# Patient Record
Sex: Female | Born: 1960 | Race: White | Hispanic: No | Smoking: Never smoker
Health system: Southern US, Community
[De-identification: ages and names within clinical notes are randomized; demographics above are authoritative.]

## PROBLEM LIST (undated history)

## (undated) DIAGNOSIS — N200 Calculus of kidney: Secondary | ICD-10-CM

## (undated) DIAGNOSIS — L900 Lichen sclerosus et atrophicus: Secondary | ICD-10-CM

## (undated) DIAGNOSIS — A6000 Herpesviral infection of urogenital system, unspecified: Secondary | ICD-10-CM

## (undated) DIAGNOSIS — F419 Anxiety disorder, unspecified: Secondary | ICD-10-CM

## (undated) DIAGNOSIS — F32A Depression, unspecified: Secondary | ICD-10-CM

## (undated) DIAGNOSIS — E785 Hyperlipidemia, unspecified: Secondary | ICD-10-CM

## (undated) DIAGNOSIS — F329 Major depressive disorder, single episode, unspecified: Secondary | ICD-10-CM

## (undated) HISTORY — DX: Depression, unspecified: F32.A

## (undated) HISTORY — PX: KNEE ARTHROSCOPY: SUR90

## (undated) HISTORY — DX: Herpesviral infection of urogenital system, unspecified: A60.00

## (undated) HISTORY — PX: TONSILLECTOMY: SUR1361

## (undated) HISTORY — DX: Major depressive disorder, single episode, unspecified: F32.9

## (undated) HISTORY — DX: Anxiety disorder, unspecified: F41.9

## (undated) HISTORY — DX: Calculus of kidney: N20.0

## (undated) HISTORY — DX: Hyperlipidemia, unspecified: E78.5

## (undated) HISTORY — PX: FOOT SURGERY: SHX648

## (undated) HISTORY — DX: Lichen sclerosus et atrophicus: L90.0

---

## 1995-07-23 HISTORY — PX: BREAST BIOPSY: SHX20

## 2007-04-11 ENCOUNTER — Ambulatory Visit: Payer: Self-pay | Admitting: Family Medicine

## 2009-01-26 ENCOUNTER — Ambulatory Visit: Payer: Self-pay | Admitting: Family Medicine

## 2009-04-17 ENCOUNTER — Ambulatory Visit: Payer: Self-pay | Admitting: Gastroenterology

## 2009-04-17 LAB — HM COLONOSCOPY

## 2010-08-31 ENCOUNTER — Ambulatory Visit: Payer: Self-pay | Admitting: Urology

## 2010-10-11 ENCOUNTER — Ambulatory Visit: Payer: Self-pay | Admitting: Urology

## 2010-10-14 ENCOUNTER — Ambulatory Visit: Payer: Self-pay | Admitting: Urology

## 2010-10-17 HISTORY — PX: LITHOTRIPSY: SUR834

## 2010-10-29 ENCOUNTER — Ambulatory Visit: Payer: Self-pay | Admitting: Urology

## 2011-01-26 ENCOUNTER — Ambulatory Visit: Payer: Self-pay | Admitting: Urology

## 2012-01-23 DIAGNOSIS — N3020 Other chronic cystitis without hematuria: Secondary | ICD-10-CM | POA: Insufficient documentation

## 2012-01-23 DIAGNOSIS — N3946 Mixed incontinence: Secondary | ICD-10-CM | POA: Insufficient documentation

## 2012-01-24 ENCOUNTER — Ambulatory Visit: Payer: Self-pay | Admitting: Urology

## 2013-01-23 ENCOUNTER — Ambulatory Visit: Payer: Self-pay | Admitting: Urology

## 2013-05-20 IMAGING — CR DG ABDOMEN 1V
1 series · 1 of 1 positions shown · non-contrast
Comparison: none

REASON FOR EXAM: nephrolithiasis pt need films
COMMENTS:

[view not recorded]
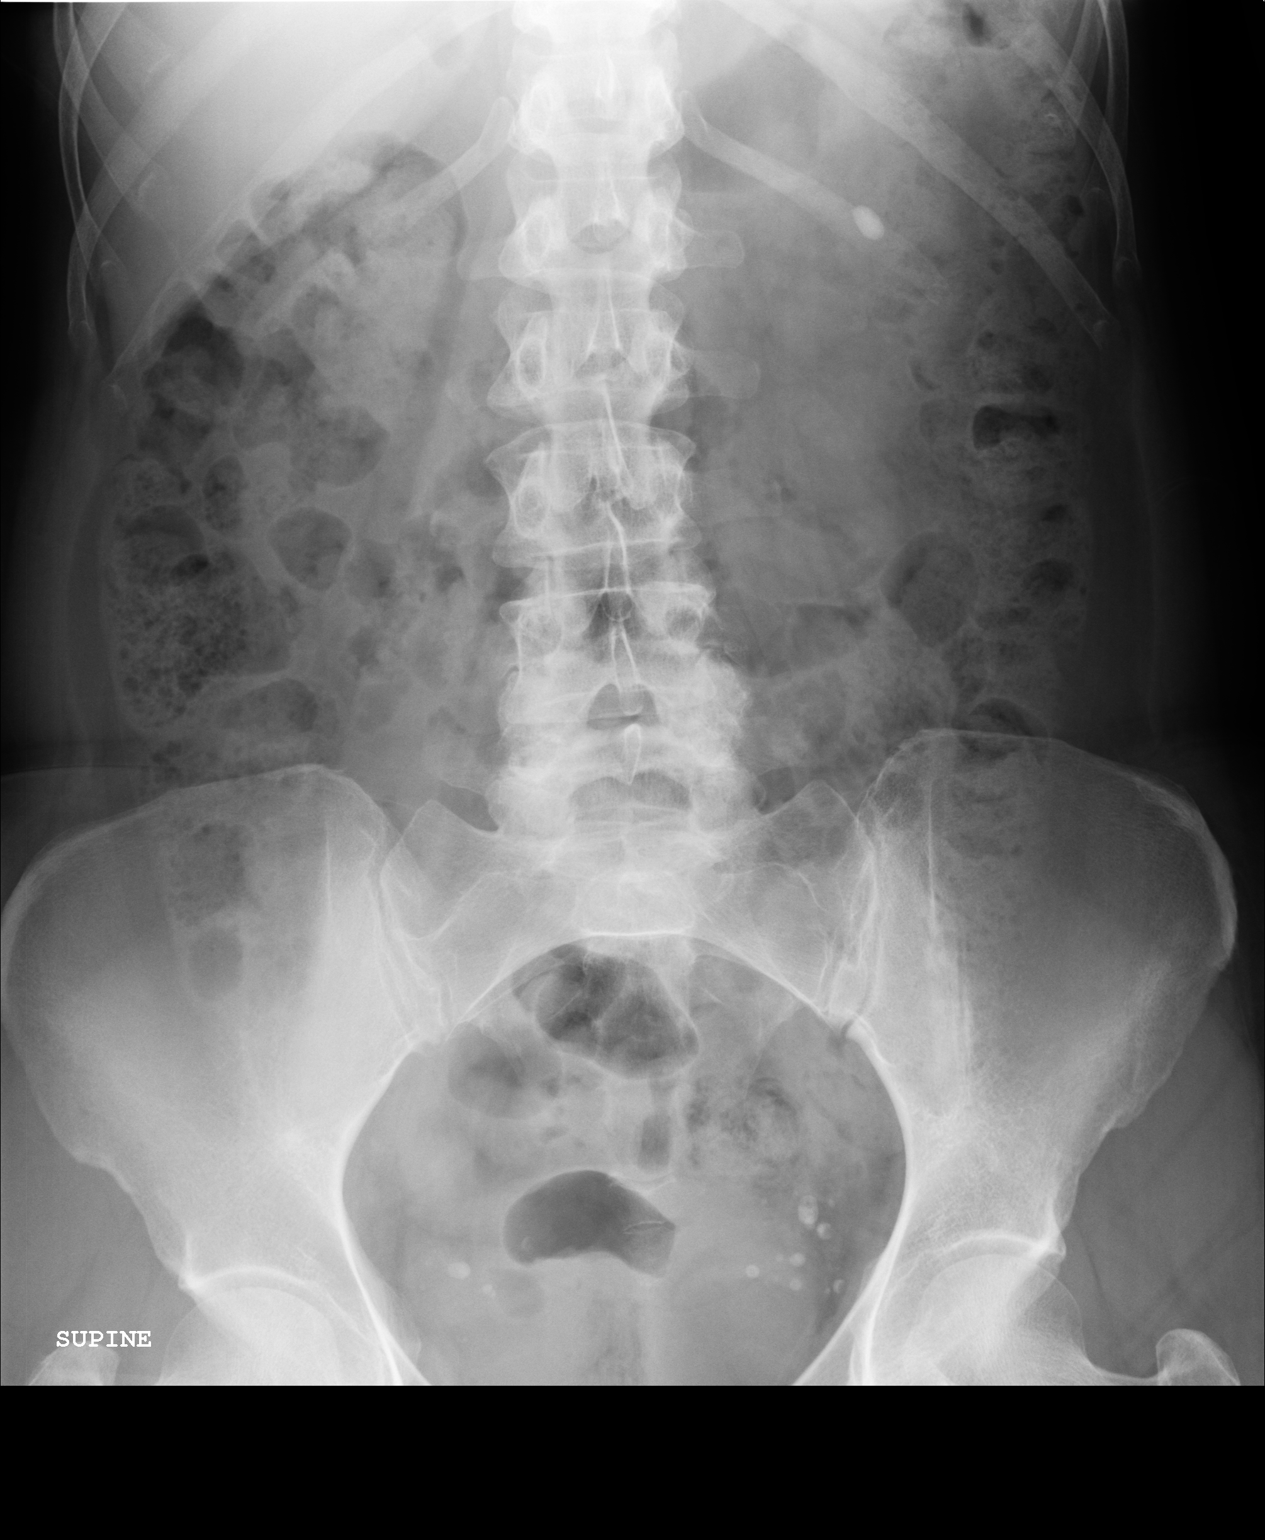

[1 of 1 positions shown; findings below may reference images not displayed]

PROCEDURE:     DXR - DXR KIDNEY URETER BLADDER  - August 31, 2010  [DATE]

RESULT:     There is a 1 cm calcification projected over the left upper
quadrant consistent with a left renal stone. No other renal or ureteral
stones are seen. There are multiple phleboliths noted in the pelvis
bilaterally. There is a large amount of fecal material in the colon. No
dilated bowel loops are seen.
IMPRESSION: Left nephrolithiasis.

## 2014-09-18 DIAGNOSIS — E559 Vitamin D deficiency, unspecified: Secondary | ICD-10-CM | POA: Insufficient documentation

## 2014-09-18 DIAGNOSIS — Z87442 Personal history of urinary calculi: Secondary | ICD-10-CM | POA: Insufficient documentation

## 2014-09-18 DIAGNOSIS — E7800 Pure hypercholesterolemia, unspecified: Secondary | ICD-10-CM | POA: Insufficient documentation

## 2014-09-18 DIAGNOSIS — N951 Menopausal and female climacteric states: Secondary | ICD-10-CM | POA: Insufficient documentation

## 2014-09-18 DIAGNOSIS — N3010 Interstitial cystitis (chronic) without hematuria: Secondary | ICD-10-CM | POA: Insufficient documentation

## 2014-09-18 DIAGNOSIS — F329 Major depressive disorder, single episode, unspecified: Secondary | ICD-10-CM | POA: Insufficient documentation

## 2014-09-18 DIAGNOSIS — F419 Anxiety disorder, unspecified: Secondary | ICD-10-CM | POA: Insufficient documentation

## 2014-09-18 DIAGNOSIS — G4700 Insomnia, unspecified: Secondary | ICD-10-CM | POA: Insufficient documentation

## 2014-09-18 DIAGNOSIS — L900 Lichen sclerosus et atrophicus: Secondary | ICD-10-CM | POA: Insufficient documentation

## 2014-09-18 DIAGNOSIS — F32A Depression, unspecified: Secondary | ICD-10-CM | POA: Insufficient documentation

## 2014-09-19 ENCOUNTER — Ambulatory Visit (INDEPENDENT_AMBULATORY_CARE_PROVIDER_SITE_OTHER): Payer: BLUE CROSS/BLUE SHIELD | Admitting: Family Medicine

## 2014-09-19 ENCOUNTER — Encounter: Payer: Self-pay | Admitting: Family Medicine

## 2014-09-19 VITALS — BP 104/62 | HR 72 | Temp 97.9°F | Resp 14 | Ht 65.0 in | Wt 139.0 lb

## 2014-09-19 DIAGNOSIS — N3091 Cystitis, unspecified with hematuria: Secondary | ICD-10-CM

## 2014-09-19 LAB — POCT URINALYSIS DIPSTICK
Bilirubin, UA: NEGATIVE
Glucose, UA: NEGATIVE
Ketones, UA: NEGATIVE
Nitrite, UA: POSITIVE
Spec Grav, UA: 1.025
Urobilinogen, UA: 0.2
pH, UA: 5

## 2014-09-19 MED ORDER — NITROFURANTOIN MONOHYD MACRO 100 MG PO CAPS: 100.0000 mg | ORAL_CAPSULE | Freq: Two times a day (BID) | ORAL | Status: DC

## 2014-09-19 NOTE — Progress Notes (Signed)
Subjective:     Patient ID: Cynthia Richards, female   DOB: 1960/08/25, 54 y.o.   MRN: [id number]  HPI  Chief Complaint  Patient presents with  . Urinary Frequency    Patient started having issues with pelvic pressure, lower back pain and frequency to urinate on Wednesday this week. She denies fver, chills, blood in urine or pain with urination. SHe has taking Motrin.  Has hx of kidney stones followed annually by Dr.Cope.   Review of Systems  Genitourinary:       Treated in April with Macrobid for cystitis.       Objective:   Physical Exam  Constitutional: She appears well-developed and well-nourished. No distress.  Genitourinary:  No cva tenderness       Assessment:    1. Cystitis with hematuria - POCT urinalysis dipstick - Urine culture - nitrofurantoin, macrocrystal-monohydrate, (MACROBID) 100 MG capsule; Take 1 capsule (100 mg total) by mouth 2 (two) times daily.  Dispense: 14 capsule; Refill: 0    Plan:    f/u pending culture results.

## 2014-09-19 NOTE — Patient Instructions (Signed)
Continue increased fluids and may try AZO or Uristat. We will call you with the urine culture.

## 2014-09-21 LAB — URINE CULTURE

## 2014-09-22 ENCOUNTER — Telehealth: Payer: Self-pay

## 2014-09-22 NOTE — Telephone Encounter (Signed)
-----   Message from Anola Gurney, [place] sent at 09/22/2014  7:46 AM EDT ----- Continue Nitrofurantoin for an E. Coli cystitis

## 2014-09-22 NOTE — Telephone Encounter (Signed)
LMTCB-KW 

## 2014-09-23 ENCOUNTER — Telehealth: Payer: Self-pay | Admitting: Family Medicine

## 2014-09-23 NOTE — Telephone Encounter (Signed)
See note about urine culture. Thanks.

## 2014-09-23 NOTE — Telephone Encounter (Signed)
LMTCB 09/23/2014  Thanks,   -Rudra Hobbins  

## 2014-09-23 NOTE — Telephone Encounter (Signed)
Patient called wanting test results form Friday.    Thanks Barth Kirks

## 2014-09-24 NOTE — Telephone Encounter (Signed)
Patient advised of urine culture results. Patient verbalized understanding and agrees with treatment plan.

## 2014-09-24 NOTE — Telephone Encounter (Signed)
LMTCB-KW 

## 2014-09-29 NOTE — Telephone Encounter (Signed)
Patient has been advised of culture. KW 

## 2014-10-06 ENCOUNTER — Encounter: Payer: Self-pay | Admitting: Family Medicine

## 2014-10-06 ENCOUNTER — Ambulatory Visit (INDEPENDENT_AMBULATORY_CARE_PROVIDER_SITE_OTHER): Payer: BLUE CROSS/BLUE SHIELD | Admitting: Family Medicine

## 2014-10-06 VITALS — BP 145/79 | HR 86 | Temp 98.1°F | Resp 16 | Ht 64.75 in | Wt 137.6 lb

## 2014-10-06 DIAGNOSIS — E7800 Pure hypercholesterolemia, unspecified: Secondary | ICD-10-CM

## 2014-10-06 DIAGNOSIS — A6000 Herpesviral infection of urogenital system, unspecified: Secondary | ICD-10-CM | POA: Diagnosis not present

## 2014-10-06 DIAGNOSIS — N390 Urinary tract infection, site not specified: Secondary | ICD-10-CM | POA: Diagnosis not present

## 2014-10-06 DIAGNOSIS — F32A Depression, unspecified: Secondary | ICD-10-CM

## 2014-10-06 DIAGNOSIS — F418 Other specified anxiety disorders: Secondary | ICD-10-CM | POA: Diagnosis not present

## 2014-10-06 DIAGNOSIS — F329 Major depressive disorder, single episode, unspecified: Secondary | ICD-10-CM

## 2014-10-06 DIAGNOSIS — F419 Anxiety disorder, unspecified: Secondary | ICD-10-CM

## 2014-10-06 LAB — POCT UA - MICROSCOPIC ONLY
Casts, Ur, LPF, POC: NEGATIVE
Crystals, Ur, HPF, POC: NEGATIVE
Mucus, UA: NEGATIVE
Yeast, UA: NEGATIVE

## 2014-10-06 LAB — POCT URINALYSIS DIPSTICK
Bilirubin, UA: NEGATIVE
Blood, UA: NEGATIVE
Glucose, UA: NEGATIVE
Ketones, UA: NEGATIVE
Nitrite, UA: POSITIVE
Protein, UA: NEGATIVE
Spec Grav, UA: 1.02
Urobilinogen, UA: 0.2
pH, UA: 6

## 2014-10-06 MED ORDER — VALACYCLOVIR HCL 500 MG PO TABS: 500.0000 mg | ORAL_TABLET | Freq: Two times a day (BID) | ORAL | Status: DC

## 2014-10-06 NOTE — Progress Notes (Signed)
Subjective:    Patient ID: Cynthia Richards, female    DOB: 10/02/1960, 54 y.o.   MRN: [id number]  10/06/2014  Establish Care   HPI This 54 y.o. female presents to establish care.   Last physical:  03-2014 [name] Pap smear:  Not sure Mammogram:  02-19-2014 Colonoscopy:  2011; repeat recommended not sure. TDAP:  BFP. Influenza:  yearly Eye exam:  Yearly; 2016 Dental exam:  Every six months  Hypercholesterolemia: Patient reports good compliance with medication, good tolerance to medication, and good symptom control.  Only taking 20mg  of Simvastatin.  Anxiety and depression:  Patient reports good compliance with medication, good tolerance to medication, and good symptom control.  Taking 1/2 Citalopram 20mg  daily.  Emotionally doing well.  Tylenol PM one at bedtime.  Work is stable.  Divorced in 2007.  [name] her daughter graduated from pharmacy school. Youngest daughter is 33 years old.  Guilt from breaking up the family. Has been dating ex-husband for years but he is still an alcoholic so needs to end relationship; now starting to date someone else.  UTI:  Suffers with recurrent UTI.  Sees Dr. Achilles Dunk.  Treated for UTI recently by Toni Arthurs; still having some symptoms; does not think infection is gone. No fever/chills/sweats. Intermittent dysuria; no n/v; no flank pain.  HSV genital: diagnosed 15 years ago.  Gynecologist saw ulcerative lesion; pt unaware of outbreak; took culture and diagnosed with HSV.  Has lichen sclerosis so suffers with irritation chronically; has never been aware of HSV outbreak.  Onset since young girl. Afraid how to share this information with new boyfriend. Very ashamed and embarrassed by dx.  Nephrolithiasis: recurrent kidney stones; calcium containing stones.  1 lithotripsy; has one stone sitting.    Review of Systems  Constitutional: Negative for fever, chills, diaphoresis and fatigue.  Eyes: Negative for visual disturbance.  Respiratory: Negative for cough  and shortness of breath.   Cardiovascular: Negative for chest pain, palpitations and leg swelling.  Gastrointestinal: Negative for nausea, vomiting, abdominal pain, diarrhea and constipation.  Endocrine: Negative for cold intolerance, heat intolerance, polydipsia, polyphagia and polyuria.  Genitourinary: Positive for dysuria. Negative for urgency, frequency, flank pain, vaginal discharge, genital sores, vaginal pain and pelvic pain.  Neurological: Negative for dizziness, tremors, seizures, syncope, facial asymmetry, speech difficulty, weakness, light-headedness, numbness and headaches.  Psychiatric/Behavioral: Negative for suicidal ideas, sleep disturbance and self-injury. The patient is nervous/anxious.     Past Medical History  Diagnosis Date  . Depression   . Kidney stones   . Hyperlipidemia   . Anxiety   . Genital HSV   . Lichen sclerosus    Past Surgical History  Procedure Laterality Date  . Lithotripsy  10/17/10  . Knee arthroscopy Right   . Breast biopsy Right 07/1995    Core biopsy-benign fibrocystic disease  . Foot surgery Bilateral     bunions  . Tonsillectomy     Allergies  Allergen Reactions  . Codeine Nausea And Vomiting  . Sulfa Antibiotics Rash   Social History   Social History  . Marital Status: Divorced    Spouse Name: N/A  . Number of Children: N/A  . Years of Education: N/A   Occupational History  . Not on file.   Social History Main Topics  . Smoking status: Never Smoker   . Smokeless tobacco: Never Used  . Alcohol Use: No  . Drug Use: No  . Sexual Activity: Not Currently   Other Topics Concern  . Not on file  Social History Narrative   Marital status: divorced in 2007 after 18 years of marriage; dating in 2016      Children: 2 daughter; no grandchildren      Lives: alone      Employment: [employer] since 2011.      Tobacco: none      Alcohol: none      Exercise: 4 days per week at gym at work.   Family History  Problem Relation Age of  Onset  . Arthritis Mother   . Hyperlipidemia Mother   . Fibromyalgia Mother   . Osteoporosis Mother   . Lupus Mother   . Brain cancer Father   . Diabetes Father   . Hypertension Father   . Cancer Father 45    brain cancer  . Heart disease Maternal Grandmother   . Hypertension Maternal Grandmother         Objective:    BP 145/79 mmHg  Pulse 86  Temp(Src) 98.1 F (36.7 C) (Oral)  Resp 16  Ht 5' 4.75" (1.645 m)  Wt 137 lb 9.6 oz (62.415 kg)  BMI 23.07 kg/m2  SpO2 98% Physical Exam  Constitutional: She is oriented to person, place, and time. She appears well-developed and well-nourished. No distress.  HENT:  Head: Normocephalic and atraumatic.  Right Ear: External ear normal.  Left Ear: External ear normal.  Nose: Nose normal.  Mouth/Throat: Oropharynx is clear and moist.  Eyes: Conjunctivae and EOM are normal. Pupils are equal, round, and reactive to light.  Neck: Normal range of motion. Neck supple. Carotid bruit is not present. No thyromegaly present.  Cardiovascular: Normal rate, regular rhythm, normal heart sounds and intact distal pulses.  Exam reveals no gallop and no friction rub.   No murmur heard. Pulmonary/Chest: Effort normal and breath sounds normal. She has no wheezes. She has no rales.  Abdominal: Soft. Bowel sounds are normal. She exhibits no distension and no mass. There is no tenderness. There is no rebound and no guarding.  Lymphadenopathy:    She has no cervical adenopathy.  Neurological: She is alert and oriented to person, place, and time. No cranial nerve deficit.  Skin: Skin is warm and dry. No rash noted. She is not diaphoretic. No erythema. No pallor.  Psychiatric: She has a normal mood and affect. Her behavior is normal.        Assessment & Plan:   1. UTI (lower urinary tract infection)   2. Pure hypercholesterolemia   3. Anxiety and depression   4. Genital herpes     1. UTI: persistent symptoms despite recent abx; repeat urine culture  which is positive; rx for Keflex provided. 2.  Hypercholesterolemia: controlled; continue medication; obtain labs. 3.  Anxiety and depression: stable; continue Citalopram. 4. HSV genital: stable; discussed treatment options; recommend daily suppressive therapy; rx for Valtrex  bid provided.   Meds ordered this encounter  Medications  . Fish Oil-Cholecalciferol (FISH OIL + D3 PO)    Sig: Take by mouth.  . valACYclovir (VALTREX) 500 MG tablet    Sig: Take 1 tablet (500 mg total) by mouth 2 (two) times daily.    Dispense:  60 tablet    Refill:  11  . cephALEXin (KEFLEX) 500 MG capsule    Sig: Take 1 capsule (500 mg total) by mouth 3 (three) times daily.    Dispense:  21 capsule    Refill:  0    Return in about 6 months (around 04/08/2015) for complete physical examiniation.  Edgel Degnan Elayne Guerin, M.D. [facility] [address] [place], [place]  [address] [phone] phone [phone] fax

## 2014-10-08 LAB — URINE CULTURE: Colony Count: >=100000

## 2014-10-09 ENCOUNTER — Telehealth: Payer: Self-pay

## 2014-10-09 MED ORDER — CEPHALEXIN 500 MG PO CAPS: 500.0000 mg | ORAL_CAPSULE | Freq: Three times a day (TID) | ORAL | Status: DC

## 2014-10-09 NOTE — Telephone Encounter (Signed)
Lab results sent to lab pool to contact pt with results; Keflex sent in for +urine culture.

## 2014-10-09 NOTE — Telephone Encounter (Signed)
Pt called about labs. She has a UTI. Can we send in her something for it If we do, please call and let her know. Ok to LM. Thanks

## 2014-10-15 ENCOUNTER — Telehealth: Payer: Self-pay

## 2014-10-15 NOTE — Telephone Encounter (Signed)
Patient is calling to request medication for yeast infection. She was recently prescribed an antibiotic. Pharmacy is [facility] in [place]. She states would like a call once the medication has been sent

## 2014-10-16 MED ORDER — FLUCONAZOLE 150 MG PO TABS: 150.0000 mg | ORAL_TABLET | Freq: Once | ORAL | Status: DC

## 2014-10-16 NOTE — Telephone Encounter (Signed)
cephALEXin (KEFLEX) 500 MG capsule [[id number]]Cynthia Richards was on Keflex on 10/09/2014. Rx sent to her pharmacy.  Left detailed message letting pt know.

## 2014-10-20 ENCOUNTER — Telehealth: Payer: Self-pay | Admitting: *Deleted

## 2014-10-20 ENCOUNTER — Other Ambulatory Visit (INDEPENDENT_AMBULATORY_CARE_PROVIDER_SITE_OTHER): Payer: BLUE CROSS/BLUE SHIELD

## 2014-10-20 ENCOUNTER — Telehealth: Payer: Self-pay

## 2014-10-20 DIAGNOSIS — E7800 Pure hypercholesterolemia, unspecified: Secondary | ICD-10-CM

## 2014-10-20 DIAGNOSIS — N390 Urinary tract infection, site not specified: Secondary | ICD-10-CM

## 2014-10-20 LAB — POCT URINALYSIS DIPSTICK
Bilirubin, UA: NEGATIVE
Glucose, UA: NEGATIVE
Ketones, UA: NEGATIVE
Nitrite, UA: POSITIVE
Protein, UA: 30
Spec Grav, UA: 1.02
Urobilinogen, UA: 0.2
pH, UA: 5.5

## 2014-10-20 LAB — CBC WITH DIFFERENTIAL/PLATELET
Basophils Absolute: 0 10*3/uL (ref 0.0–0.1)
Basophils Relative: 0 % (ref 0–1)
Eosinophils Absolute: 0.1 10*3/uL (ref 0.0–0.7)
Eosinophils Relative: 1 % (ref 0–5)
HCT: 37.8 % (ref 36.0–46.0)
Hemoglobin: 12.8 g/dL (ref 12.0–15.0)
Lymphocytes Relative: 14 % (ref 12–46)
Lymphs Abs: 1.8 10*3/uL (ref 0.7–4.0)
MCH: 30.9 pg (ref 26.0–34.0)
MCHC: 33.9 g/dL (ref 30.0–36.0)
MCV: 91.3 fL (ref 78.0–100.0)
MPV: 10.9 fL (ref 8.6–12.4)
Monocytes Absolute: 1 10*3/uL (ref 0.1–1.0)
Monocytes Relative: 8 % (ref 3–12)
Neutro Abs: 10 10*3/uL — ABNORMAL HIGH (ref 1.7–7.7)
Neutrophils Relative %: 77 % (ref 43–77)
Platelets: 208 10*3/uL (ref 150–400)
RBC: 4.14 MIL/uL (ref 3.87–5.11)
RDW: 13.1 % (ref 11.5–15.5)
WBC: 13 10*3/uL — ABNORMAL HIGH (ref 4.0–10.5)

## 2014-10-20 LAB — POCT UA - MICROSCOPIC ONLY
Casts, Ur, LPF, POC: NEGATIVE
Crystals, Ur, HPF, POC: NEGATIVE
Mucus, UA: POSITIVE
Yeast, UA: NEGATIVE

## 2014-10-20 LAB — COMPREHENSIVE METABOLIC PANEL
ALT: 13 U/L (ref 6–29)
AST: 17 U/L (ref 10–35)
Albumin: 4.5 g/dL (ref 3.6–5.1)
Alkaline Phosphatase: 82 U/L (ref 33–130)
BUN: 16 mg/dL (ref 7–25)
CO2: 27 mmol/L (ref 20–31)
Calcium: 9.5 mg/dL (ref 8.6–10.4)
Chloride: 104 mmol/L (ref 98–110)
Creat: 0.72 mg/dL (ref 0.50–1.05)
Glucose, Bld: 84 mg/dL (ref 65–99)
Potassium: 4.6 mmol/L (ref 3.5–5.3)
Sodium: 138 mmol/L (ref 135–146)
Total Bilirubin: 0.5 mg/dL (ref 0.2–1.2)
Total Protein: 6.7 g/dL (ref 6.1–8.1)

## 2014-10-20 LAB — LIPID PANEL
Cholesterol: 150 mg/dL (ref 125–200)
HDL: 55 mg/dL (ref >=46)
LDL Cholesterol: 55 mg/dL (ref <130)
Total CHOL/HDL Ratio: 2.7 Ratio (ref <=5.0)
Triglycerides: 198 mg/dL — ABNORMAL HIGH (ref <150)
VLDL: 40 mg/dL — ABNORMAL HIGH (ref <30)

## 2014-10-20 NOTE — Telephone Encounter (Signed)
Advised pt that she has a refill on Diflucan

## 2014-10-20 NOTE — Progress Notes (Signed)
Pt is for lab work only. 

## 2014-10-20 NOTE — Telephone Encounter (Signed)
Pt was prescribed diflucan [phone] and she does not feel like the yeast infection has completely gone away. Can we give her another Diflucan Rx?

## 2014-10-20 NOTE — Addendum Note (Signed)
Addended by: Thelma Barge D on: 10/20/2014 09:56 AM   Modules accepted: Orders

## 2014-10-20 NOTE — Telephone Encounter (Signed)
Urine results reviewed; added Urine culture.

## 2014-10-20 NOTE — Telephone Encounter (Signed)
Pt still having urinary symptoms and wants her urine to be rechecked.  She also states that she is not sure if it is coming from the UTI or yeast.  Advised pt that she had a refill on medication for yeast.    Orders placed for U/A with  [facility].

## 2014-10-21 ENCOUNTER — Encounter: Payer: Self-pay | Admitting: Family Medicine

## 2014-10-22 ENCOUNTER — Other Ambulatory Visit: Payer: Self-pay | Admitting: Family Medicine

## 2014-10-22 DIAGNOSIS — N390 Urinary tract infection, site not specified: Secondary | ICD-10-CM

## 2014-10-25 ENCOUNTER — Other Ambulatory Visit: Payer: Self-pay | Admitting: Family Medicine

## 2014-10-25 MED ORDER — CIPROFLOXACIN HCL 500 MG PO TABS: 500.0000 mg | ORAL_TABLET | Freq: Two times a day (BID) | ORAL | Status: DC

## 2015-02-24 LAB — HM MAMMOGRAPHY

## 2015-03-04 ENCOUNTER — Encounter: Payer: Self-pay | Admitting: Family Medicine

## 2015-03-20 ENCOUNTER — Telehealth: Payer: Self-pay | Admitting: Emergency Medicine

## 2015-03-20 ENCOUNTER — Encounter: Payer: Self-pay | Admitting: Family Medicine

## 2015-03-20 ENCOUNTER — Ambulatory Visit (INDEPENDENT_AMBULATORY_CARE_PROVIDER_SITE_OTHER): Payer: 59 | Admitting: Family Medicine

## 2015-03-20 VITALS — BP 116/72 | HR 75 | Temp 98.0°F | Resp 16 | Wt 145.0 lb

## 2015-03-20 DIAGNOSIS — R1030 Lower abdominal pain, unspecified: Secondary | ICD-10-CM

## 2015-03-20 LAB — POCT URINALYSIS DIPSTICK
Bilirubin, UA: NEGATIVE
Blood, UA: NEGATIVE
Glucose, UA: NEGATIVE
Ketones, UA: NEGATIVE
Leukocytes, UA: NEGATIVE
Nitrite, UA: NEGATIVE
Protein, UA: NEGATIVE
Spec Grav, UA: 1.025
Urobilinogen, UA: 0.2
pH, UA: 6

## 2015-03-20 MED ORDER — METRONIDAZOLE 500 MG PO TABS: 500.0000 mg | ORAL_TABLET | Freq: Four times a day (QID) | ORAL | Status: DC

## 2015-03-20 MED ORDER — CIPROFLOXACIN HCL 500 MG PO TABS: 500.0000 mg | ORAL_TABLET | Freq: Two times a day (BID) | ORAL | Status: DC

## 2015-03-20 NOTE — Telephone Encounter (Signed)
Take the citalopram every other day while on Cipro

## 2015-03-20 NOTE — Progress Notes (Signed)
Subjective:     Patient ID: Cynthia Richards, female   DOB: [birth year], 55 y.o.   MRN: [id number]  HPI  Chief Complaint  Patient presents with  . Abdominal Pain    Patient comes in office today with concerns of a possible urinary infection. Patient states on Sunday she began to have abdominal pain in her lower abdomen that radiated to her back. Patient reports redness, irritation and burning in her vaginal area, patient denies any change in soap or detergent.   States she has taken Macrobid twice daily for the last 4 days. PMH of interstitial cystitis, vulvar lichen sclerosis. Genital HSV, and diverticulosis. Reports she is sexually monogamous   Review of Systems  Constitutional: Negative for fever and chills.  Gastrointestinal: Negative for constipation and blood in stool.  Genitourinary: Negative for vaginal discharge.       Objective:   Physical Exam  Constitutional: She appears well-developed and well-nourished. No distress.  Abdominal: There is tenderness (bilateral lower qudrants in one specific area ). There is guarding.  Genitourinary:  No vulvar rash or discharge appreciated on inspection       Assessment:    1. Lower abdominal pain: c/w diverticulitis - POCT urinalysis dipstick - ciprofloxacin (CIPRO) 500 MG tablet; Take 1 tablet (500 mg total) by mouth 2 (two) times daily.  Dispense: 14 tablet; Refill: 0 - metroNIDAZOLE (FLAGYL) 500 MG tablet; Take 1 tablet (500 mg total) by mouth 4 (four) times daily.  Dispense: 28 tablet; Refill: 0    Plan:    Discussed clear liquids/ soups for 24 hours. Start metronidazole in 48 hours if Cipro alone not helping.

## 2015-03-20 NOTE — Telephone Encounter (Signed)
[facility] called with a drug interaction. Cipro and her citalopram can cause QT prolongation and she wanted to run this by you before dispensing to pt. It looks like she has taken Cipro before but was not taking the Citalopram at the time. Please advise.

## 2015-03-20 NOTE — Patient Instructions (Addendum)
Start metronidazole along with the Cipro if abdominal pain not improving in 24-48 hours. Discussed use of clear liquids or soups for the first 24 hours.

## 2015-03-20 NOTE — Telephone Encounter (Signed)
Pharmacy informed.

## 2015-03-30 ENCOUNTER — Encounter: Payer: Self-pay | Admitting: Family Medicine

## 2015-04-13 ENCOUNTER — Encounter: Payer: BLUE CROSS/BLUE SHIELD | Admitting: Family Medicine

## 2015-04-14 ENCOUNTER — Encounter: Payer: Self-pay | Admitting: Family Medicine

## 2015-04-14 ENCOUNTER — Ambulatory Visit (INDEPENDENT_AMBULATORY_CARE_PROVIDER_SITE_OTHER): Payer: 59 | Admitting: Family Medicine

## 2015-04-14 VITALS — BP 108/70 | HR 63 | Temp 98.2°F | Resp 16 | Ht 65.0 in | Wt 138.0 lb

## 2015-04-14 DIAGNOSIS — F32A Depression, unspecified: Secondary | ICD-10-CM

## 2015-04-14 DIAGNOSIS — Z23 Encounter for immunization: Secondary | ICD-10-CM | POA: Diagnosis not present

## 2015-04-14 DIAGNOSIS — Z131 Encounter for screening for diabetes mellitus: Secondary | ICD-10-CM | POA: Diagnosis not present

## 2015-04-14 DIAGNOSIS — Z0000 Encounter for general adult medical examination without abnormal findings: Secondary | ICD-10-CM | POA: Diagnosis not present

## 2015-04-14 DIAGNOSIS — Z87442 Personal history of urinary calculi: Secondary | ICD-10-CM | POA: Diagnosis not present

## 2015-04-14 DIAGNOSIS — F419 Anxiety disorder, unspecified: Secondary | ICD-10-CM

## 2015-04-14 DIAGNOSIS — E559 Vitamin D deficiency, unspecified: Secondary | ICD-10-CM | POA: Diagnosis not present

## 2015-04-14 DIAGNOSIS — Z114 Encounter for screening for human immunodeficiency virus [HIV]: Secondary | ICD-10-CM | POA: Diagnosis not present

## 2015-04-14 DIAGNOSIS — E785 Hyperlipidemia, unspecified: Secondary | ICD-10-CM | POA: Diagnosis not present

## 2015-04-14 DIAGNOSIS — Z1159 Encounter for screening for other viral diseases: Secondary | ICD-10-CM

## 2015-04-14 DIAGNOSIS — N3010 Interstitial cystitis (chronic) without hematuria: Secondary | ICD-10-CM | POA: Diagnosis not present

## 2015-04-14 DIAGNOSIS — E7800 Pure hypercholesterolemia, unspecified: Secondary | ICD-10-CM

## 2015-04-14 DIAGNOSIS — F329 Major depressive disorder, single episode, unspecified: Secondary | ICD-10-CM | POA: Diagnosis not present

## 2015-04-14 DIAGNOSIS — L900 Lichen sclerosus et atrophicus: Secondary | ICD-10-CM

## 2015-04-14 LAB — CBC WITH DIFFERENTIAL/PLATELET
Basophils Absolute: 0.1 10*3/uL (ref 0.0–0.1)
Basophils Relative: 1 % (ref 0–1)
Eosinophils Absolute: 0.1 10*3/uL (ref 0.0–0.7)
Eosinophils Relative: 1 % (ref 0–5)
HCT: 42.8 % (ref 36.0–46.0)
Hemoglobin: 14.8 g/dL (ref 12.0–15.0)
Lymphocytes Relative: 34 % (ref 12–46)
Lymphs Abs: 2.6 10*3/uL (ref 0.7–4.0)
MCH: 33 pg (ref 26.0–34.0)
MCHC: 34.6 g/dL (ref 30.0–36.0)
MCV: 95.5 fL (ref 78.0–100.0)
MPV: 11.1 fL (ref 8.6–12.4)
Monocytes Absolute: 0.6 10*3/uL (ref 0.1–1.0)
Monocytes Relative: 8 % (ref 3–12)
Neutro Abs: 4.2 10*3/uL (ref 1.7–7.7)
Neutrophils Relative %: 56 % (ref 43–77)
Platelets: 261 10*3/uL (ref 150–400)
RBC: 4.48 MIL/uL (ref 3.87–5.11)
RDW: 12.8 % (ref 11.5–15.5)
WBC: 7.5 10*3/uL (ref 4.0–10.5)

## 2015-04-14 LAB — COMPREHENSIVE METABOLIC PANEL
ALT: 12 U/L (ref 6–29)
AST: 14 U/L (ref 10–35)
Albumin: 4.5 g/dL (ref 3.6–5.1)
Alkaline Phosphatase: 84 U/L (ref 33–130)
BUN: 17 mg/dL (ref 7–25)
CO2: 27 mmol/L (ref 20–31)
Calcium: 9.4 mg/dL (ref 8.6–10.4)
Chloride: 105 mmol/L (ref 98–110)
Creat: 0.76 mg/dL (ref 0.50–1.05)
Glucose, Bld: 92 mg/dL (ref 65–99)
Potassium: 3.9 mmol/L (ref 3.5–5.3)
Sodium: 141 mmol/L (ref 135–146)
Total Bilirubin: 0.4 mg/dL (ref 0.2–1.2)
Total Protein: 7.2 g/dL (ref 6.1–8.1)

## 2015-04-14 LAB — POCT URINALYSIS DIP (MANUAL ENTRY)
Bilirubin, UA: NEGATIVE
Glucose, UA: NEGATIVE
Ketones, POC UA: NEGATIVE
Nitrite, UA: NEGATIVE
Protein Ur, POC: NEGATIVE
Spec Grav, UA: 1.025
Urobilinogen, UA: 0.2
pH, UA: 5

## 2015-04-14 LAB — LIPID PANEL
Cholesterol: 180 mg/dL (ref 125–200)
HDL: 65 mg/dL (ref >=46)
LDL Cholesterol: 90 mg/dL (ref <130)
Total CHOL/HDL Ratio: 2.8 Ratio (ref <=5.0)
Triglycerides: 126 mg/dL (ref <150)
VLDL: 25 mg/dL (ref <30)

## 2015-04-14 LAB — HIV ANTIBODY (ROUTINE TESTING W REFLEX): HIV 1&2 Ab, 4th Generation: NONREACTIVE

## 2015-04-14 LAB — HEMOGLOBIN A1C
Hgb A1c MFr Bld: 5.8 % — ABNORMAL HIGH (ref <5.7)
Mean Plasma Glucose: 120 mg/dL — ABNORMAL HIGH (ref <117)

## 2015-04-14 LAB — HEPATITIS C ANTIBODY: HCV Ab: NEGATIVE

## 2015-04-14 LAB — TSH: TSH: 0.8 mIU/L

## 2015-04-14 LAB — VITAMIN B12: Vitamin B-12: 819 pg/mL (ref 200–1100)

## 2015-04-14 MED ORDER — CITALOPRAM HYDROBROMIDE 20 MG PO TABS: 20.0000 mg | ORAL_TABLET | Freq: Every day | ORAL | Status: DC

## 2015-04-14 MED ORDER — SIMVASTATIN 40 MG PO TABS: 40.0000 mg | ORAL_TABLET | Freq: Every day | ORAL | Status: DC

## 2015-04-14 MED ORDER — VALACYCLOVIR HCL 500 MG PO TABS: 500.0000 mg | ORAL_TABLET | Freq: Two times a day (BID) | ORAL | Status: DC

## 2015-04-14 MED ORDER — CLOBETASOL PROPIONATE 0.05 % EX CREA: 1.0000 "application " | TOPICAL_CREAM | Freq: Two times a day (BID) | CUTANEOUS | Status: AC

## 2015-04-14 NOTE — Progress Notes (Signed)
Subjective:    Patient ID: Cynthia Richards, female    DOB: 04/02/1960, 55 y.o.   MRN: [id number]  04/14/2015  Annual Exam and Medication Refill   HPI This 55 y.o. female presents for Complete Physical Examination.  Last physical:  2016 Pap smear:  03-24-2013 Mammogram:  02/2015 Colonoscopy:  03-2009; every ten years; [place].  Bone density: TDAP:BFP;  Influenza:  10/2014 at work. Eye exam:  02/2015 Dental exam: due in March 2017.    Review of Systems  Constitutional: Negative for fever, chills, diaphoresis, activity change, appetite change, fatigue and unexpected weight change.  HENT: Negative for congestion, dental problem, drooling, ear discharge, ear pain, facial swelling, hearing loss, mouth sores, nosebleeds, postnasal drip, rhinorrhea, sinus pressure, sneezing, sore throat, tinnitus, trouble swallowing and voice change.   Eyes: Negative for photophobia, pain, discharge, redness, itching and visual disturbance.  Respiratory: Negative for apnea, cough, choking, chest tightness, shortness of breath, wheezing and stridor.   Cardiovascular: Negative for chest pain, palpitations and leg swelling.  Gastrointestinal: Negative for nausea, vomiting, abdominal pain, diarrhea, constipation, blood in stool, abdominal distention, anal bleeding and rectal pain.  Endocrine: Negative for cold intolerance, heat intolerance, polydipsia, polyphagia and polyuria.  Genitourinary: Negative for dysuria, urgency, frequency, hematuria, flank pain, decreased urine volume, vaginal bleeding, vaginal discharge, enuresis, difficulty urinating, genital sores, vaginal pain, menstrual problem, pelvic pain and dyspareunia.  Musculoskeletal: Negative for myalgias, back pain, joint swelling, arthralgias, gait problem, neck pain and neck stiffness.  Skin: Negative for color change, pallor, rash and wound.  Allergic/Immunologic: Negative for environmental allergies, food allergies and immunocompromised state.  Neurological:  Negative for dizziness, tremors, seizures, syncope, facial asymmetry, speech difficulty, weakness, light-headedness, numbness and headaches.  Hematological: Negative for adenopathy. Does not bruise/bleed easily.  Psychiatric/Behavioral: Negative for suicidal ideas, hallucinations, behavioral problems, confusion, sleep disturbance, self-injury, dysphoric mood, decreased concentration and agitation. The patient is not nervous/anxious and is not hyperactive.     Past Medical History  Diagnosis Date  . Depression   . Kidney stones   . Hyperlipidemia   . Anxiety   . Genital HSV   . Lichen sclerosus    Past Surgical History  Procedure Laterality Date  . Lithotripsy  10/17/10  . Knee arthroscopy Right   . Breast biopsy Right 07/1995    Core biopsy-benign fibrocystic disease  . Foot surgery Bilateral     bunions  . Tonsillectomy     Allergies  Allergen Reactions  . Codeine Nausea And Vomiting  . Sulfa Antibiotics Rash  . Sulfacetamide Sodium Rash   Current Outpatient Prescriptions  Medication Sig Dispense Refill  . Biotin 3 MG TABS Take by mouth.    . Diphenhydramine-APAP, sleep, (TYLENOL PM EXTRA STRENGTH) 50-1000 MG/30ML LIQD Take by mouth.    . docusate sodium (COLACE) 100 MG capsule Take by mouth.    Andrey Campanile VAGINAL 0.1 MG/GM vaginal cream     . Fish Oil-Cholecalciferol (FISH OIL + D3 PO) Take by mouth.    . Magnesium 100 MG CAPS Take by mouth.    . Multiple Vitamin tablet Take by mouth.    . nitrofurantoin (MACRODANTIN) 100 MG capsule     . valACYclovir (VALTREX) 500 MG tablet Take 1 tablet (500 mg total) by mouth 2 (two) times daily. 180 tablet 3  . citalopram (CELEXA) 20 MG tablet Take 1 tablet (20 mg total) by mouth daily. 90 tablet 3  . clobetasol cream (TEMOVATE) 0.05 % Apply 1 application topically 2 (two) times daily. 45  g 1  . simvastatin (ZOCOR) 40 MG tablet Take 1 tablet (40 mg total) by mouth at bedtime. 90 tablet 3   No current facility-administered medications  for this visit.   Social History   Social History  . Marital Status: Divorced    Spouse Name: N/A  . Number of Children: N/A  . Years of Education: N/A   Occupational History  . Not on file.   Social History Main Topics  . Smoking status: Never Smoker   . Smokeless tobacco: Never Used  . Alcohol Use: No  . Drug Use: No  . Sexual Activity: Not Currently   Other Topics Concern  . Not on file   Social History Narrative   Marital status: divorced in 2007 after 18 years of marriage due to husband's alcoholism; dating in 2017.        Children: 2 daughters; no grandchildren      Lives: alone      Employment: [employer] since 2011 [place].      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      Seatbelt: 100%; no texting   Family History  Problem Relation Age of Onset  . Arthritis Mother   . Hyperlipidemia Mother   . Fibromyalgia Mother   . Osteoporosis Mother   . Lupus Mother   . Stroke Mother   . Brain cancer Father   . Diabetes Father   . Hypertension Father   . Cancer Father 45    brain cancer  . Heart disease Maternal Grandmother   . Hypertension Maternal Grandmother        Objective:    BP 108/70 mmHg  Pulse 63  Temp(Src) 98.2 F (36.8 C)  Resp 16  Ht  (1.651 m)  Wt 138 lb (62.596 kg)  BMI 22.96 kg/m2  SpO2 99% Physical Exam  Constitutional: She is oriented to person, place, and time. She appears well-developed and well-nourished. No distress.  HENT:  Head: Normocephalic and atraumatic.  Right Ear: External ear normal.  Left Ear: External ear normal.  Nose: Nose normal.  Mouth/Throat: Oropharynx is clear and moist.  Eyes: Conjunctivae and EOM are normal. Pupils are equal, round, and reactive to light.  Neck: Normal range of motion and full passive range of motion without pain. Neck supple. No JVD present. Carotid bruit is not present. No thyromegaly present.    Cardiovascular: Normal rate, regular rhythm and normal heart sounds.  Exam reveals no gallop and no friction rub.   No murmur heard. Pulmonary/Chest: Effort normal and breath sounds normal. She has no wheezes. She has no rales.  Abdominal: Soft. Bowel sounds are normal. She exhibits no distension and no mass. There is no tenderness. There is no rebound and no guarding.  Musculoskeletal:       Right shoulder: Normal.       Left shoulder: Normal.       Cervical back: Normal.  Lymphadenopathy:    She has no cervical adenopathy.  Neurological: She is alert and oriented to person, place, and time. She has normal reflexes. No cranial nerve deficit. She exhibits normal muscle tone. Coordination normal.  Skin: Skin is warm and dry. No rash noted. She is not diaphoretic. No erythema. No pallor.  Psychiatric: She has a normal mood and affect. Her behavior is normal. Judgment and thought content normal.  Nursing  note and vitals reviewed.       Assessment & Plan:   1. Routine physical examination   2. Lichen sclerosus   3. Hypercholesteremia   4. H/O renal calculi   5. Clinical depression   6. Chronic interstitial cystitis   7. Avitaminosis D   8. Anxiety   9. Need for hepatitis C screening test   10. Screening for HIV (human immunodeficiency virus)   11. Hyperlipidemia   12. Screening for diabetes mellitus     Orders Placed This Encounter  Procedures  . Tdap vaccine greater than or equal to 7yo IM  . CBC with Differential/Platelet  . Comprehensive metabolic panel    Order Specific Question:  Has the patient fasted?    Answer:  Yes  . Hemoglobin A1c  . Lipid panel    Order Specific Question:  Has the patient fasted?    Answer:  Yes  . TSH  . HIV antibody  . Vitamin B12  . VITAMIN D 25 Hydroxy (Vit-D Deficiency, Fractures)  . Hepatitis C antibody  . POCT urinalysis dipstick   Meds ordered this encounter  Medications  . clobetasol cream (TEMOVATE) 0.05 %    Sig: Apply 1  application topically 2 (two) times daily.    Dispense:  45 g    Refill:  1  . simvastatin (ZOCOR) 40 MG tablet    Sig: Take 1 tablet (40 mg total) by mouth at bedtime.    Dispense:  90 tablet    Refill:  3  . citalopram (CELEXA) 20 MG tablet    Sig: Take 1 tablet (20 mg total) by mouth daily.    Dispense:  90 tablet    Refill:  3  . valACYclovir (VALTREX) 500 MG tablet    Sig: Take 1 tablet (500 mg total) by mouth 2 (two) times daily.    Dispense:  180 tablet    Refill:  3    Return in about 6 months (around 10/12/2015) for recheck cholesterol.    Danton Palmateer Paulita Fujita, M.D. [facility] [address] [place], [place]  [address] [phone] phone [phone] fax

## 2015-04-14 NOTE — Patient Instructions (Signed)

## 2015-04-15 LAB — VITAMIN D 25 HYDROXY (VIT D DEFICIENCY, FRACTURES): Vit D, 25-Hydroxy: 41 ng/mL (ref 30–100)

## 2015-08-20 ENCOUNTER — Ambulatory Visit: Payer: 59 | Admitting: Family Medicine

## 2015-10-13 ENCOUNTER — Ambulatory Visit (INDEPENDENT_AMBULATORY_CARE_PROVIDER_SITE_OTHER): Payer: 59 | Admitting: Family Medicine

## 2015-10-13 ENCOUNTER — Encounter: Payer: Self-pay | Admitting: Family Medicine

## 2015-10-13 VITALS — BP 114/70 | HR 66 | Temp 97.6°F | Resp 17 | Ht 65.0 in | Wt 139.0 lb

## 2015-10-13 DIAGNOSIS — N3010 Interstitial cystitis (chronic) without hematuria: Secondary | ICD-10-CM | POA: Diagnosis not present

## 2015-10-13 DIAGNOSIS — Z87442 Personal history of urinary calculi: Secondary | ICD-10-CM | POA: Diagnosis not present

## 2015-10-13 DIAGNOSIS — E559 Vitamin D deficiency, unspecified: Secondary | ICD-10-CM

## 2015-10-13 DIAGNOSIS — F419 Anxiety disorder, unspecified: Secondary | ICD-10-CM

## 2015-10-13 DIAGNOSIS — B373 Candidiasis of vulva and vagina: Secondary | ICD-10-CM | POA: Diagnosis not present

## 2015-10-13 DIAGNOSIS — E7800 Pure hypercholesterolemia, unspecified: Secondary | ICD-10-CM | POA: Diagnosis not present

## 2015-10-13 DIAGNOSIS — B3731 Acute candidiasis of vulva and vagina: Secondary | ICD-10-CM

## 2015-10-13 DIAGNOSIS — L900 Lichen sclerosus et atrophicus: Secondary | ICD-10-CM

## 2015-10-13 DIAGNOSIS — F329 Major depressive disorder, single episode, unspecified: Secondary | ICD-10-CM

## 2015-10-13 DIAGNOSIS — R7302 Impaired glucose tolerance (oral): Secondary | ICD-10-CM

## 2015-10-13 DIAGNOSIS — F32A Depression, unspecified: Secondary | ICD-10-CM

## 2015-10-13 LAB — COMPREHENSIVE METABOLIC PANEL
ALT: 13 U/L (ref 6–29)
AST: 16 U/L (ref 10–35)
Albumin: 4.5 g/dL (ref 3.6–5.1)
Alkaline Phosphatase: 71 U/L (ref 33–130)
BUN: 24 mg/dL (ref 7–25)
CO2: 29 mmol/L (ref 20–31)
Calcium: 9.5 mg/dL (ref 8.6–10.4)
Chloride: 105 mmol/L (ref 98–110)
Creat: 0.75 mg/dL (ref 0.50–1.05)
Glucose, Bld: 79 mg/dL (ref 65–99)
Potassium: 4.9 mmol/L (ref 3.5–5.3)
Sodium: 140 mmol/L (ref 135–146)
Total Bilirubin: 0.5 mg/dL (ref 0.2–1.2)
Total Protein: 7.1 g/dL (ref 6.1–8.1)

## 2015-10-13 LAB — CBC WITH DIFFERENTIAL/PLATELET
Basophils Absolute: 65 cells/uL (ref 0–200)
Basophils Relative: 1 %
Eosinophils Absolute: 130 cells/uL (ref 15–500)
Eosinophils Relative: 2 %
HCT: 41.8 % (ref 35.0–45.0)
Hemoglobin: 14.4 g/dL (ref 11.7–15.5)
Lymphocytes Relative: 38 %
Lymphs Abs: 2470 cells/uL (ref 850–3900)
MCH: 33 pg (ref 27.0–33.0)
MCHC: 34.4 g/dL (ref 32.0–36.0)
MCV: 95.9 fL (ref 80.0–100.0)
MPV: 11 fL (ref 7.5–12.5)
Monocytes Absolute: 585 cells/uL (ref 200–950)
Monocytes Relative: 9 %
Neutro Abs: 3250 cells/uL (ref 1500–7800)
Neutrophils Relative %: 50 %
Platelets: 233 10*3/uL (ref 140–400)
RBC: 4.36 MIL/uL (ref 3.80–5.10)
RDW: 12.6 % (ref 11.0–15.0)
WBC: 6.5 10*3/uL (ref 3.8–10.8)

## 2015-10-13 LAB — LIPID PANEL
Cholesterol: 193 mg/dL (ref 125–200)
HDL: 77 mg/dL (ref >=46)
LDL Cholesterol: 95 mg/dL (ref <130)
Total CHOL/HDL Ratio: 2.5 Ratio (ref <=5.0)
Triglycerides: 105 mg/dL (ref <150)
VLDL: 21 mg/dL (ref <30)

## 2015-10-13 MED ORDER — NYSTATIN 100000 UNIT/GM EX OINT: 1.0000 "application " | TOPICAL_OINTMENT | Freq: Two times a day (BID) | CUTANEOUS | 0 refills | Status: DC

## 2015-10-13 NOTE — Progress Notes (Signed)
By signing my name below, I, Mesha Guinyard, attest that this documentation has been prepared under the direction and in the presence of Treatment Team:  Attending Provider: Ethelda Chick, MD.  Electronically Signed: Arvilla Market, Medical Scribe. 10/19/15. 9:26 AM.  Subjective:    Patient ID: Cynthia Richards, female    DOB: 10/15/1960, 55 y.o.   MRN: [id number]   10/13/2015  Follow-up Loney Laurence )   HPI  HPI Comments: Cynthia Richards is a 55 y.o. female who presents to the [facility] for a 6 month HLD, anxiety, glucose intolerance, and depression follow-up. Pt takes a  super B vitamin complex, a multi vitamin, and teaspoon of copper liver oil.  HLD: Takes Simvastatin 20 mg. Pt denies chest pain, and heart palpitations.  Anxiety/Depression: Pt takes Celexa 10 mg daily. Pt sates she has her good days and her bad days, but she's hanging in there. Pt's dad has progressing Parkinson's in his legs and she helps to take care of him. Pt's mom has lupus and can't takes care of him, so she has a CMA come help in the morning. Pt is the only child, but she still gets help from her neighbors. Pt gets plenty of sleep when she can. Depression screen [facility] 2/9 10/13/2015 04/14/2015 10/06/2014  Decreased Interest 0 0 0  Down, Depressed, Hopeless 0 0 0  PHQ - 2 Score 0 0 0   Exercise: Pt does cardio and strength exercise through her job, and has been exercising for 2 years. Pt has to help pick up her 200 lbs dad with no trouble.  Glucose Intolerance: Pt is adjusting her diet by watching what she eats and cutting back on her sugars.  Pt states it feels like it's worsening burning erythema but she isn't straining very much due to the stool softener she takes. Pt is currently a little irritated. Pt's left eye is running, and she was coughing a little yesterday. Pt uses aloe on her eye for relief to her symptoms. Pt had poison oak a while back, and she went to [facility]. Pt's  symptoms have subsided since then.  Review of Systems  Constitutional: Negative for chills, diaphoresis, fatigue and fever.  Eyes: Positive for discharge. Negative for visual disturbance.  Respiratory: Positive for cough. Negative for shortness of breath.   Cardiovascular: Negative for chest pain, palpitations and leg swelling.  Gastrointestinal: Negative for abdominal pain, constipation, diarrhea, nausea and vomiting.  Endocrine: Negative for cold intolerance, heat intolerance, polydipsia, polyphagia and polyuria.  Allergic/Immunologic: Positive for environmental allergies.  Neurological: Negative for dizziness, tremors, seizures, syncope, facial asymmetry, speech difficulty, weakness, light-headedness, numbness and headaches.  Psychiatric/Behavioral: Negative for dysphoric mood and sleep disturbance. The patient is not nervous/anxious.     Past Medical History:  Diagnosis Date  . Anxiety   . Depression   . Genital HSV   . Hyperlipidemia   . Kidney stones   . Lichen sclerosus    Past Surgical History:  Procedure Laterality Date  . BREAST BIOPSY Right 07/1995   Core biopsy-benign fibrocystic disease  . FOOT SURGERY Bilateral    bunions  . KNEE ARTHROSCOPY Right   . LITHOTRIPSY  10/17/10  . TONSILLECTOMY     Allergies  Allergen Reactions  . Codeine Nausea And Vomiting  . Sulfa Antibiotics Rash  . Sulfacetamide Sodium Rash    Social History   Social History  . Marital status: Divorced    Spouse name: N/A  .  Number of children: N/A  . Years of education: N/A   Occupational History  . Not on file.   Social History Main Topics  . Smoking status: Never Smoker  . Smokeless tobacco: Never Used  . Alcohol use No  . Drug use: No  . Sexual activity: Not Currently   Other Topics Concern  . Not on file   Social History Narrative   Marital status: divorced in 2007 after 18 years of marriage due to husband's alcoholism; dating in 2017.        Children: 2 daughters; no  grandchildren      Lives: alone      Em[place] [employer] [place]      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      Seatbelt: 100%; no texting   Family History  Problem Relation Age of Onset  . Arthritis Mother   . Hyperlipidemia Mother   . Fibromyalgia Mother   . Osteoporosis Mother   . Lupus Mother   . Stroke Mother   . Brain cancer Father   . Diabetes Father   . Hypertension Father   . Cancer Father 45    brain cancer  . Heart disease Maternal Grandmother   . Hypertension Maternal Grandmother        Objective:    BP 114/70 (BP Location: Left Arm, Patient Position: Sitting, Cuff Size: Large)   Pulse 66   Temp 97.6 F (36.4 C) (Oral)   Resp 17   Ht 5\' 5"  (1.651 m)   Wt 139 lb (63 kg)   SpO2 98%   BMI 23.13 kg/m  Physical Exam  Constitutional: She is oriented to person, place, and time. She appears well-developed and well-nourished. No distress.  HENT:  Head: Normocephalic and atraumatic.  Right Ear: External ear normal.  Left Ear: External ear normal.  Nose: Nose normal.  Mouth/Throat: Oropharynx is clear and moist.  Eyes: Conjunctivae and EOM are normal. Pupils are equal, round, and reactive to light.  Neck: Normal range of motion. Neck supple. Carotid bruit is not present. No thyromegaly present.  Cardiovascular: Normal rate, regular rhythm, normal heart sounds and intact distal pulses.  Exam reveals no gallop and no friction rub.   No murmur heard. Pulmonary/Chest: Effort normal and breath sounds normal. No respiratory distress. She has no wheezes. She has no rales.  Abdominal: Soft. Bowel sounds are normal. She exhibits no distension and no mass. There is no tenderness. There is no rebound and no guarding.  Genitourinary: Rectal exam shows external hemorrhoid (not thrombose).     Genitourinary Comments: Diffuse erythema that extended to the perianal region    Lymphadenopathy:    She has no cervical adenopathy.  Neurological: She is alert and oriented to person, place, and time. No cranial nerve deficit.  Skin: Skin is warm and dry. No rash noted. She is not diaphoretic. No erythema. No pallor.  Psychiatric: She has a normal mood and affect. Her behavior is normal.  Nursing note and vitals reviewed.     Assessment & Plan:   1. Hypercholesteremia   2. H/O renal calculi   3. Clinical depression   4. Anxiety   5. Lichen sclerosus   6. Chronic interstitial cystitis   7. Avitaminosis D   8. Glucose intolerance (impaired glucose tolerance)   9. Vulvovaginal candidiasis  Orders Placed This Encounter  Procedures  . CBC with Differential/Platelet  . Comprehensive metabolic panel    Order Specific Question:   Has the patient fasted?    Answer:   Yes  . Lipid panel    Order Specific Question:   Has the patient fasted?    Answer:   Yes  . Hemoglobin A1c   Meds ordered this encounter  Medications  . nystatin ointment (MYCOSTATIN)    Sig: Apply 1 application topically 2 (two) times daily.    Dispense:  30 g    Refill:  0    Return in about 6 months (around 04/14/2016) for complete physical examiniation.  I personally performed the services described in this documentation, which was scribed in my presence. The recorded information has been reviewed and considered.  Jasiah Buntin Paulita FujitaMartin Loeta Herst, M.D. [facility]  [place] [address] [place], [place]  [id number] [phone] phone [phone] fax

## 2015-10-13 NOTE — Patient Instructions (Signed)
     IF you received an x-ray today, you will receive an invoice from [facility]. Please contact [facility] at [phone] with questions or concerns regarding your invoice.   IF you received labwork today, you will receive an invoice from [facility]. Please contact [facility] at [phone] with questions or concerns regarding your invoice.   Our billing staff will not be able to assist you with questions regarding bills from these companies.  You will be contacted with the lab results as soon as they are available. The fastest way to get your results is to activate your My Chart account. Instructions are located on the last page of this paperwork. If you have not heard from us regarding the results in 2 weeks, please contact this office.      

## 2015-10-14 LAB — HEMOGLOBIN A1C
Hgb A1c MFr Bld: 5.5 % (ref <5.7)
Mean Plasma Glucose: 111 mg/dL

## 2015-10-16 ENCOUNTER — Telehealth: Payer: Self-pay

## 2015-10-17 NOTE — Telephone Encounter (Signed)
Phone message placed 

## 2016-02-25 LAB — HM MAMMOGRAPHY

## 2016-04-12 ENCOUNTER — Telehealth: Payer: Self-pay

## 2016-04-12 NOTE — Telephone Encounter (Signed)
PA needed for clobetesol Key cytrtn Cover my meds

## 2016-04-13 NOTE — Telephone Encounter (Signed)
Submitted to Cover my Meds.  Waiting for response.

## 2016-04-14 NOTE — Telephone Encounter (Signed)
Denied. Must try preferred med on formulary But no preferred given?

## 2016-04-14 NOTE — Telephone Encounter (Signed)
Pt will contact her insurance to find out preferred and call us back

## 2016-04-19 ENCOUNTER — Ambulatory Visit (INDEPENDENT_AMBULATORY_CARE_PROVIDER_SITE_OTHER): Payer: BLUE CROSS/BLUE SHIELD | Admitting: Family Medicine

## 2016-04-19 ENCOUNTER — Encounter: Payer: Self-pay | Admitting: Family Medicine

## 2016-04-19 DIAGNOSIS — Z136 Encounter for screening for cardiovascular disorders: Secondary | ICD-10-CM

## 2016-04-19 DIAGNOSIS — N3010 Interstitial cystitis (chronic) without hematuria: Secondary | ICD-10-CM | POA: Diagnosis not present

## 2016-04-19 DIAGNOSIS — F419 Anxiety disorder, unspecified: Secondary | ICD-10-CM

## 2016-04-19 DIAGNOSIS — E7800 Pure hypercholesterolemia, unspecified: Secondary | ICD-10-CM

## 2016-04-19 DIAGNOSIS — Z124 Encounter for screening for malignant neoplasm of cervix: Secondary | ICD-10-CM

## 2016-04-19 DIAGNOSIS — Z0000 Encounter for general adult medical examination without abnormal findings: Secondary | ICD-10-CM

## 2016-04-19 DIAGNOSIS — L900 Lichen sclerosus et atrophicus: Secondary | ICD-10-CM | POA: Diagnosis not present

## 2016-04-19 DIAGNOSIS — N3020 Other chronic cystitis without hematuria: Secondary | ICD-10-CM

## 2016-04-19 DIAGNOSIS — R7302 Impaired glucose tolerance (oral): Secondary | ICD-10-CM

## 2016-04-19 LAB — POCT URINALYSIS DIP (MANUAL ENTRY)
Bilirubin, UA: NEGATIVE
Blood, UA: NEGATIVE
Glucose, UA: NEGATIVE
Ketones, POC UA: NEGATIVE
Nitrite, UA: NEGATIVE
Protein Ur, POC: NEGATIVE
Spec Grav, UA: 1.02
Urobilinogen, UA: 0.2
pH, UA: 5.5

## 2016-04-19 MED ORDER — VALACYCLOVIR HCL 500 MG PO TABS: 500.0000 mg | ORAL_TABLET | Freq: Two times a day (BID) | ORAL | 3 refills | Status: DC

## 2016-04-19 MED ORDER — CITALOPRAM HYDROBROMIDE 20 MG PO TABS: 20.0000 mg | ORAL_TABLET | Freq: Every day | ORAL | 3 refills | Status: DC

## 2016-04-19 MED ORDER — TRIAMCINOLONE ACETONIDE 0.1 % EX CREA: 1.0000 "application " | TOPICAL_CREAM | Freq: Two times a day (BID) | CUTANEOUS | 2 refills | Status: AC

## 2016-04-19 MED ORDER — SIMVASTATIN 40 MG PO TABS: 40.0000 mg | ORAL_TABLET | Freq: Every day | ORAL | 3 refills | Status: DC

## 2016-04-19 NOTE — Patient Instructions (Addendum)
   IF you received an x-ray today, you will receive an invoice from [facility]. Please contact [facility] at [phone] with questions or concerns regarding your invoice.   IF you received labwork today, you will receive an invoice from [facility]. Please contact [facility] at [phone] with questions or concerns regarding your invoice.   Our billing staff will not be able to assist you with questions regarding bills from these companies.  You will be contacted with the lab results as soon as they are available. The fastest way to get your results is to activate your My Chart account. Instructions are located on the last page of this paperwork. If you have not heard from us regarding the results in 2 weeks, please contact this office.     cKeeping You Healthy  Get These Tests  Blood Pressure- Have your blood pressure checked by your healthcare provider at least once a year.  Normal blood pressure is 120/80.  Weight- Have your body mass index (BMI) calculated to screen for obesity.  BMI is a measure of body fat based on height and weight.  You can calculate your own BMI at www.nhlbisupport.com/bmi/  Cholesterol- Have your cholesterol checked every year.  Diabetes- Have your blood sugar checked every year if you have high blood pressure, high cholesterol, a family history of diabetes or if you are overweight.  Pap Test - Have a pap test every 1 to 5 years if you have been sexually active.  If you are older than 65 and recent pap tests have been normal you may not need additional pap tests.  In addition, if you have had a hysterectomy  for benign disease additional pap tests are not necessary.  Mammogram-Yearly mammograms are essential for early detection of breast cancer  Screening for Colon Cancer- Colonoscopy starting at age 50. Screening may begin sooner depending on your family history and other health conditions.  Follow up colonoscopy as directed by your  Gastroenterologist.  Screening for Osteoporosis- Screening begins at age 65 with bone density scanning, sooner if you are at higher risk for developing Osteoporosis.  Get these medicines  Calcium with Vitamin D- Your body requires 1200-1500 mg of Calcium a day and 800-1000 IU of Vitamin D a day.  You can only absorb 500 mg of Calcium at a time therefore Calcium must be taken in 2 or 3 separate doses throughout the day.  Hormones- Hormone therapy has been associated with increased risk for certain cancers and heart disease.  Talk to your healthcare provider about if you need relief from menopausal symptoms.  Aspirin- Ask your healthcare provider about taking Aspirin to prevent Heart Disease and Stroke.  Get these Immuniztions  Flu shot- Every fall  Pneumonia shot- Once after the age of 65; if you are younger ask your healthcare provider if you need a pneumonia shot.  Tetanus- Every ten years.  Zostavax- Once after the age of 60 to prevent shingles.  Take these steps  Don't smoke- Your healthcare provider can help you quit. For tips on how to quit, ask your healthcare provider or go to www.smokefree.gov or call 1-800 QUIT-NOW.  Be physically active- Exercise 5 days a week for a minimum of 30 minutes.  If you are not already physically active, start slow and gradually work up to 30 minutes of moderate physical activity.  Try walking, dancing, bike riding, swimming, etc.  Eat a healthy diet- Eat a variety of healthy foods such as fruits, vegetables, whole grains, low   fat milk, low fat cheeses, yogurt, lean meats, chicken, fish, eggs, dried beans, tofu, etc.  For more information go to www.thenutritionsource.org  Dental visit- Brush and floss teeth twice daily; visit your dentist twice a year.  Eye exam- Visit your Optometrist or Ophthalmologist yearly.  Drink alcohol in moderation- Limit alcohol intake to one drink or less a day.  Never drink and drive.  Depression- Your emotional  health is as important as your physical health.  If you're feeling down or losing interest in things you normally enjoy, please talk to your healthcare provider.  Seat Belts- can save your life; always wear one  Smoke/Carbon Monoxide detectors- These detectors need to be installed on the appropriate level of your home.  Replace batteries at least once a year.  Violence- If anyone is threatening or hurting you, please tell your healthcare provider. Living Will/ Health care power of attorney- Discuss with your healthcare provider and family. 

## 2016-04-19 NOTE — Progress Notes (Signed)
Subjective:    Patient ID: Cynthia Richards, female    DOB: 09/26/1960, 56 y.o.   MRN: [id number]  04/19/2016  Annual Exam   HPI This 56 y.o. female presents for Complete Physical Examination.  Last physical:  04-14-2015 Pap smear: 03-24-2013 Mammogram:  02-25-2016 Colonoscopy:  2011; no polyps. Eye exam: 02-2016 Dental exam:  Every six months.  Immunization History  Administered Date(s) Administered  . DTaP 02/26/2009  . Influenza-Unspecified 10/23/2014, 11/28/2015  . Tdap 04/14/2015   BP Readings from Last 3 Encounters:  10/13/15 114/70  04/14/15 108/70  03/20/15 116/72   Wt Readings from Last 3 Encounters:  10/13/15 139 lb (63 kg)  04/14/15 138 lb (62.6 kg)  03/20/15 145 lb (65.8 kg)     Review of Systems  Constitutional: Negative for activity change, appetite change, chills, diaphoresis, fatigue, fever and unexpected weight change.  HENT: Negative for congestion, dental problem, drooling, ear discharge, ear pain, facial swelling, hearing loss, mouth sores, nosebleeds, postnasal drip, rhinorrhea, sinus pressure, sneezing, sore throat, tinnitus, trouble swallowing and voice change.   Eyes: Negative for photophobia, pain, discharge, redness, itching and visual disturbance.  Respiratory: Negative for apnea, cough, choking, chest tightness, shortness of breath, wheezing and stridor.   Cardiovascular: Negative for chest pain, palpitations and leg swelling.  Gastrointestinal: Negative for abdominal distention, abdominal pain, anal bleeding, blood in stool, constipation, diarrhea, nausea, rectal pain and vomiting.  Endocrine: Negative for cold intolerance, heat intolerance, polydipsia, polyphagia and polyuria.  Genitourinary: Negative for decreased urine volume, difficulty urinating, dyspareunia, dysuria, enuresis, flank pain, frequency, genital sores, hematuria, menstrual problem, pelvic pain, urgency, vaginal bleeding, vaginal discharge and vaginal pain.  Musculoskeletal:  Negative for arthralgias, back pain, gait problem, joint swelling, myalgias, neck pain and neck stiffness.  Skin: Negative for color change, pallor, rash and wound.  Allergic/Immunologic: Negative for environmental allergies, food allergies and immunocompromised state.  Neurological: Negative for dizziness, tremors, seizures, syncope, facial asymmetry, speech difficulty, weakness, light-headedness, numbness and headaches.  Hematological: Negative for adenopathy. Does not bruise/bleed easily.  Psychiatric/Behavioral: Negative for agitation, behavioral problems, confusion, decreased concentration, dysphoric mood, hallucinations, self-injury, sleep disturbance and suicidal ideas. The patient is not nervous/anxious and is not hyperactive.     Past Medical History:  Diagnosis Date  . Anxiety   . Depression   . Genital HSV   . Hyperlipidemia   . Kidney stones   . Lichen sclerosus    Past Surgical History:  Procedure Laterality Date  . BREAST BIOPSY Right 07/1995   Core biopsy-benign fibrocystic disease  . FOOT SURGERY Bilateral    bunions  . KNEE ARTHROSCOPY Right   . LITHOTRIPSY  10/17/10  . TONSILLECTOMY     Allergies  Allergen Reactions  . Codeine Nausea And Vomiting  . Sulfa Antibiotics Rash  . Sulfacetamide Sodium Rash    Social History   Social History  . Marital status: Divorced    Spouse name: N/A  . Number of children: 2  . Years of education: N/A   Occupational History  . Landproject coordinator    Social History Main Topics  . Smoking status: Never Smoker  . Smokeless tobacco: Never Used  . Alcohol use No  . Drug use: No  . Sexual activity: Not Currently    Birth control/ protection: Post-menopausal   Other Topics Concern  . Not on file   Social History Narrative   Marital status: divorced in 2007 after 18 years of marriage due to husband's alcoholism; dating in 2017 and 2018.  Children: 2 daughters; no grandchildren      Lives: alone; daughters in [place]  and [place]      Employment: [employer] since 2011 in [place].      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      Seatbelt: 100%; no texting   Family History  Problem Relation Age of Onset  . Arthritis Mother   . Hyperlipidemia Mother   . Fibromyalgia Mother   . Osteoporosis Mother   . Lupus Mother   . Stroke Mother   . Brain cancer Father   . Diabetes Father   . Hypertension Father   . Cancer Father 45    brain cancer  . Heart disease Maternal Grandmother   . Hypertension Maternal Grandmother        Objective:    There were no vitals taken for this visit. Physical Exam  Constitutional: She is oriented to person, place, and time. She appears well-developed and well-nourished. No distress.  HENT:  Head: Normocephalic and atraumatic.  Right Ear: External ear normal.  Left Ear: External ear normal.  Nose: Nose normal.  Mouth/Throat: Oropharynx is clear and moist.  Eyes: Conjunctivae and EOM are normal. Pupils are equal, round, and reactive to light.  Neck: Normal range of motion and full passive range of motion without pain. Neck supple. No JVD present. Carotid bruit is not present. No thyromegaly present.  Cardiovascular: Normal rate, regular rhythm and normal heart sounds.  Exam reveals no gallop and no friction rub.   No murmur heard. Pulmonary/Chest: Effort normal and breath sounds normal. She has no wheezes. She has no rales. Right breast exhibits no inverted nipple, no mass, no nipple discharge, no skin change and no tenderness. Left breast exhibits no inverted nipple, no mass, no nipple discharge, no skin change and no tenderness. Breasts are symmetrical.  Abdominal: Soft. Bowel sounds are normal. She exhibits no distension and no mass. There is no tenderness. There is no rebound and no guarding.  Genitourinary: Vagina normal and uterus normal. There is no rash, tenderness, lesion or  injury on the right labia. There is no rash, tenderness, lesion or injury on the left labia. Cervix exhibits no motion tenderness. Right adnexum displays no mass, no tenderness and no fullness. Left adnexum displays no mass, no tenderness and no fullness.  Musculoskeletal:       Right shoulder: Normal.       Left shoulder: Normal.       Cervical back: Normal.  Lymphadenopathy:    She has no cervical adenopathy.  Neurological: She is alert and oriented to person, place, and time. She has normal reflexes. No cranial nerve deficit. She exhibits normal muscle tone. Coordination normal.  Skin: Skin is warm and dry. No rash noted. She is not diaphoretic. No erythema. No pallor.  Psychiatric: She has a normal mood and affect. Her behavior is normal. Judgment and thought content normal.  Nursing note and vitals reviewed.   Depression screen [facility] 2/9 04/19/2016 10/13/2015 04/14/2015 10/06/2014  Decreased Interest 0 0 0 0  Down, Depressed, Hopeless 0 0 0 0  PHQ - 2 Score 0 0 0 0   Fall Risk  04/19/2016 10/13/2015 04/14/2015  Falls in the past year? No No No       Assessment & Plan:   1. Routine physical examination   2. Glucose intolerance (impaired  glucose tolerance)   3. Lichen sclerosus   4. Chronic interstitial cystitis   5. Bladder infection, chronic   6. Anxiety   7. Hypercholesteremia   8. Screening for cardiovascular condition   9. Cervical cancer screening    -anticipatory guidance provided -obtain age appropriate screening labs. -pap smear obtained -refills provided.   Orders Placed This Encounter  Procedures  . CBC with Differential/Platelet  . Comprehensive metabolic panel    Order Specific Question:   Has the patient fasted?    Answer:   Yes  . Hemoglobin A1c  . Lipid panel    Order Specific Question:   Has the patient fasted?    Answer:   Yes  . TSH  . Vitamin B12  . VITAMIN D 25 Hydroxy (Vit-D Deficiency, Fractures)  . POCT urinalysis dipstick  . EKG 12-Lead    Meds ordered this encounter  Medications  . triamcinolone cream (KENALOG) 0.1 %    Sig: Apply 1 application topically 2 (two) times daily.    Dispense:  30 g    Refill:  2  . citalopram (CELEXA) 20 MG tablet    Sig: Take 1 tablet (20 mg total) by mouth daily.    Dispense:  90 tablet    Refill:  3  . DISCONTD: valACYclovir (VALTREX) 500 MG tablet    Sig: Take 1 tablet (500 mg total) by mouth 2 (two) times daily.    Dispense:  180 tablet    Refill:  3  . simvastatin (ZOCOR) 40 MG tablet    Sig: Take 1 tablet (40 mg total) by mouth at bedtime.    Dispense:  90 tablet    Refill:  3  . valACYclovir (VALTREX) 500 MG tablet    Sig: Take 1 tablet (500 mg total) by mouth 2 (two) times daily.    Dispense:  180 tablet    Refill:  3    Return in about 6 months (around 10/17/2016) for recheck cholesterol, anxiety.   Colter Magowan Paulita Fujita, M.D. [facility] at [facility] [address] [place], [place]  [address] [phone] phone [phone] fax

## 2016-04-20 LAB — CBC WITH DIFFERENTIAL/PLATELET
Basophils Absolute: 0.1 10*3/uL (ref 0.0–0.2)
Basos: 1 %
EOS (ABSOLUTE): 0.1 10*3/uL (ref 0.0–0.4)
Eos: 2 %
Hematocrit: 41 % (ref 34.0–46.6)
Hemoglobin: 13.8 g/dL (ref 11.1–15.9)
Immature Grans (Abs): 0 10*3/uL (ref 0.0–0.1)
Immature Granulocytes: 0 %
Lymphocytes Absolute: 2.4 10*3/uL (ref 0.7–3.1)
Lymphs: 42 %
MCH: 32.2 pg (ref 26.6–33.0)
MCHC: 33.7 g/dL (ref 31.5–35.7)
MCV: 96 fL (ref 79–97)
Monocytes Absolute: 0.5 10*3/uL (ref 0.1–0.9)
Monocytes: 9 %
Neutrophils Absolute: 2.7 10*3/uL (ref 1.4–7.0)
Neutrophils: 46 %
Platelets: 242 10*3/uL (ref 150–379)
RBC: 4.29 x10E6/uL (ref 3.77–5.28)
RDW: 13.1 % (ref 12.3–15.4)
WBC: 5.8 10*3/uL (ref 3.4–10.8)

## 2016-04-20 LAB — COMPREHENSIVE METABOLIC PANEL
ALT: 13 IU/L (ref 0–32)
AST: 17 IU/L (ref 0–40)
Albumin/Globulin Ratio: 1.7 (ref 1.2–2.2)
Albumin: 4.6 g/dL (ref 3.5–5.5)
Alkaline Phosphatase: 92 IU/L (ref 39–117)
BUN/Creatinine Ratio: 36 — ABNORMAL HIGH (ref 9–23)
BUN: 21 mg/dL (ref 6–24)
Bilirubin Total: 0.3 mg/dL (ref 0.0–1.2)
CO2: 24 mmol/L (ref 18–29)
Calcium: 9.4 mg/dL (ref 8.7–10.2)
Chloride: 104 mmol/L (ref 96–106)
Creatinine, Ser: 0.59 mg/dL (ref 0.57–1.00)
GFR calc Af Amer: 118 mL/min/{1.73_m2} (ref >59)
GFR calc non Af Amer: 102 mL/min/{1.73_m2} (ref >59)
Globulin, Total: 2.7 g/dL (ref 1.5–4.5)
Glucose: 89 mg/dL (ref 65–99)
Potassium: 4.6 mmol/L (ref 3.5–5.2)
Sodium: 144 mmol/L (ref 134–144)
Total Protein: 7.3 g/dL (ref 6.0–8.5)

## 2016-04-20 LAB — LIPID PANEL
Chol/HDL Ratio: 3.1 ratio units (ref 0.0–4.4)
Cholesterol, Total: 188 mg/dL (ref 100–199)
HDL: 61 mg/dL (ref >39)
LDL Calculated: 100 mg/dL — ABNORMAL HIGH (ref 0–99)
Triglycerides: 134 mg/dL (ref 0–149)
VLDL Cholesterol Cal: 27 mg/dL (ref 5–40)

## 2016-04-20 LAB — HEMOGLOBIN A1C
Est. average glucose Bld gHb Est-mCnc: 108 mg/dL
Hgb A1c MFr Bld: 5.4 % (ref 4.8–5.6)

## 2016-04-20 LAB — TSH: TSH: 0.808 u[IU]/mL (ref 0.450–4.500)

## 2016-04-20 LAB — VITAMIN B12: Vitamin B-12: 1553 pg/mL — ABNORMAL HIGH (ref 232–1245)

## 2016-04-20 LAB — VITAMIN D 25 HYDROXY (VIT D DEFICIENCY, FRACTURES): Vit D, 25-Hydroxy: 45.8 ng/mL (ref 30.0–100.0)

## 2016-04-22 LAB — PAP IG, CT-NG NAA, HPV HIGH-RISK
Chlamydia, Nuc. Acid Amp: NEGATIVE
Gonococcus by Nucleic Acid Amp: NEGATIVE
HPV, high-risk: NEGATIVE
PAP Smear Comment: 0

## 2016-04-28 ENCOUNTER — Other Ambulatory Visit: Payer: Self-pay | Admitting: Family Medicine

## 2016-04-29 ENCOUNTER — Other Ambulatory Visit: Payer: Self-pay | Admitting: Family Medicine

## 2016-10-17 NOTE — Progress Notes (Signed)
Subjective:    Patient ID: Cynthia Richards, female    DOB: [birth year], 58 y.o.   MRN: [id number]  10/18/2016  Anxiety (6 month follow-up) and Hyperlipidemia   HPI This 56 y.o. female presents for evaluation of hypercholesterolemia, anxiety, HSV genital.  Patient reports good compliance with medication, good tolerance to medication, and good symptom control.   No changes to management at last visit; all labs normal.    Went last week for sinusitis and bronchitis.  [facility]; Amoxicillin and Tessalon Perles.   Taking allergy medication; last week with scratchy throat; motrin and tylenol; then acutely declined.  Then progressed to chest.  Has been taking Tussionex and Mucinex without improvement.  Dr. Achilles Dunk appointment in May 2018; nephrolithiasis in LEFT kidney.  No increase in size.  Recurrent UTI; has two UTIs per year.Gave Macrobid if one develops.    Went in April 2018 for dermatology check; no cancerous lesions; actinic keratoses.  [facility].    Getting adjusted to losing dad in November 2018.  Helping mother out a lot.  Mother is 49 in July.  DDD cervical spine.  Receiving in home therapy; mother is not driving.  GAD 7 : Generalized Anxiety Score 10/18/2016  Nervous, Anxious, on Edge 1  Control/stop worrying 0  Worry too much - different things 0  Trouble relaxing 0  Restless 0  Easily annoyed or irritable 0  Afraid - awful might happen 0  Total GAD 7 Score 1  Anxiety Difficulty Not difficult at all     BP Readings from Last 3 Encounters:  10/18/16 120/81  10/13/15 114/70  04/14/15 108/70   Wt Readings from Last 3 Encounters:  10/18/16 141 lb (64 kg)  10/13/15 139 lb (63 kg)  04/14/15 138 lb (62.6 kg)   Immunization History  Administered Date(s) Administered  . DTaP 02/26/2009  . Influenza,inj,Quad PF,6+ Mos 10/18/2016  . Influenza-Unspecified 10/23/2014, 11/28/2015  . Tdap 04/14/2015    Review of Systems  Constitutional:  Negative for chills, diaphoresis, fatigue and fever.  HENT: Positive for congestion, postnasal drip, rhinorrhea, sinus pain and sinus pressure. Negative for ear pain.   Eyes: Negative for visual disturbance.  Respiratory: Positive for cough. Negative for shortness of breath and wheezing.   Cardiovascular: Negative for chest pain, palpitations and leg swelling.  Gastrointestinal: Negative for abdominal pain, constipation, diarrhea, nausea and vomiting.  Endocrine: Negative for cold intolerance, heat intolerance, polydipsia, polyphagia and polyuria.  Genitourinary: Negative for difficulty urinating, dysuria, flank pain, frequency, hematuria, pelvic pain and urgency.  Neurological: Negative for dizziness, tremors, seizures, syncope, facial asymmetry, speech difficulty, weakness, light-headedness, numbness and headaches.  Psychiatric/Behavioral: Negative for dysphoric mood, self-injury, sleep disturbance and suicidal ideas. The patient is not nervous/anxious.     Past Medical History:  Diagnosis Date  . Anxiety   . Depression   . Genital HSV   . Hyperlipidemia   . Kidney stones   . Lichen sclerosus    Past Surgical History:  Procedure Laterality Date  . BREAST BIOPSY Right 07/1995   Core biopsy-benign fibrocystic disease  . FOOT SURGERY Bilateral    bunions  . KNEE ARTHROSCOPY Right   . LITHOTRIPSY  10/17/10  . TONSILLECTOMY     Allergies  Allergen Reactions  . Codeine Nausea And Vomiting  . Sulfa Antibiotics Rash  . Sulfacetamide Sodium Rash   Current Outpatient Prescriptions  Medication Sig Dispense Refill  . amoxicillin-clavulanate (AUGMENTIN) 125-31.25 MG/5ML suspension Take by mouth 2 (  two) times daily.    . benzonatate (TESSALON) 200 MG capsule Take 200 mg by mouth 3 (three) times daily as needed for cough.    . Biotin 3 MG TABS Take by mouth.    . citalopram (CELEXA) 20 MG tablet Take 1 tablet (20 mg total) by mouth daily. 90 tablet 3  . clobetasol cream (TEMOVATE) 0.05  % Apply 1 application topically 2 (two) times daily. 45 g 1  . Diphenhydramine-APAP, sleep, (TYLENOL PM EXTRA STRENGTH) 50-1000 MG/30ML LIQD Take by mouth.    . docusate sodium (COLACE) 100 MG capsule Take by mouth.    Andrey Campanile VAGINAL 0.1 MG/GM vaginal cream     . Fish Oil-Cholecalciferol (FISH OIL + D3 PO) Take by mouth.    . Multiple Vitamin tablet Take by mouth.    . nystatin ointment (MYCOSTATIN) Apply 1 application topically 2 (two) times daily. 30 g 0  . simvastatin (ZOCOR) 40 MG tablet Take 1 tablet (40 mg total) by mouth at bedtime. 90 tablet 3  . triamcinolone cream (KENALOG) 0.1 % Apply 1 application topically 2 (two) times daily. 30 g 2  . valACYclovir (VALTREX) 500 MG tablet take 1 tablet by mouth twice a day 180 tablet 3   No current facility-administered medications for this visit.    Social History   Social History  . Marital status: Divorced    Spouse name: N/A  . Number of children: 2  . Years of education: N/A   Occupational History  . Land    Social History Main Topics  . Smoking status: Never Smoker  . Smokeless tobacco: Never Used  . Alcohol use No  . Drug use: No  . Sexual activity: Not Currently    Birth control/ protection: Post-menopausal   Other Topics Concern  . Not on file   Social History Narrative   Marital status: divorced in 2007 after 18 years of marriage due to husband's alcoholism; dating in 2017 and 2018.      Children: 2 daughters; no grandchildren      Lives: alone; daughters in [place] and [place]      Employment: [employer] since 2011 in [place].      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      Seatbelt: 100%; no texting   Family History  Problem Relation Age of Onset  . Arthritis Mother   . Hyperlipidemia Mother   . Fibromyalgia Mother   . Osteoporosis Mother   . Lupus Mother   . Stroke Mother   . Brain cancer Father   .  Diabetes Father   . Hypertension Father   . Cancer Father 45       brain cancer  . Heart disease Maternal Grandmother   . Hypertension Maternal Grandmother        Objective:    BP 120/81   Pulse 72   Temp 98.2 F (36.8 C) (Oral)   Resp 16   Ht 5' 4.76" (1.645 m)   Wt 141 lb (64 kg)   SpO2 97%   BMI 23.63 kg/m  Physical Exam  Constitutional: She is oriented to person, place, and time. She appears well-developed and well-nourished. No distress.  HENT:  Head: Normocephalic and atraumatic.  Right Ear: External ear normal.  Left Ear: External ear normal.  Nose: Nose normal.  Mouth/Throat: Oropharynx is clear and  moist.  Eyes: Pupils are equal, round, and reactive to light. Conjunctivae and EOM are normal.  Neck: Normal range of motion. Neck supple. Carotid bruit is not present. No thyromegaly present.  Cardiovascular: Normal rate, regular rhythm, normal heart sounds and intact distal pulses.  Exam reveals no gallop and no friction rub.   No murmur heard. Pulmonary/Chest: Effort normal and breath sounds normal. She has no wheezes. She has no rales.  Abdominal: Soft. Bowel sounds are normal. She exhibits no distension and no mass. There is no tenderness. There is no rebound and no guarding.  Lymphadenopathy:    She has no cervical adenopathy.  Neurological: She is alert and oriented to person, place, and time. No cranial nerve deficit.  Skin: Skin is warm and dry. No rash noted. She is not diaphoretic. No erythema. No pallor.  Psychiatric: She has a normal mood and affect. Her behavior is normal.   Results for orders placed or performed in visit on 04/19/16  CBC with Differential/Platelet  Result Value Ref Range   WBC 5.8 3.4 - 10.8 x10E3/uL   RBC 4.29 3.77 - 5.28 x10E6/uL   Hemoglobin 13.8 11.1 - 15.9 g/dL   Hematocrit 40.9 81.1 - 46.6 %   MCV 96 79 - 97 fL   MCH 32.2 26.6 - 33.0 pg   MCHC 33.7 31.5 - 35.7 g/dL   RDW 91.4 78.2 - 95.6 %   Platelets 242 150 - 379 x10E3/uL     Neutrophils 46 Not Estab. %   Lymphs 42 Not Estab. %   Monocytes 9 Not Estab. %   Eos 2 Not Estab. %   Basos 1 Not Estab. %   Neutrophils Absolute 2.7 1.4 - 7.0 x10E3/uL   Lymphocytes Absolute 2.4 0.7 - 3.1 x10E3/uL   Monocytes Absolute 0.5 0.1 - 0.9 x10E3/uL   EOS (ABSOLUTE) 0.1 0.0 - 0.4 x10E3/uL   Basophils Absolute 0.1 0.0 - 0.2 x10E3/uL   Immature Granulocytes 0 Not Estab. %   Immature Grans (Abs) 0.0 0.0 - 0.1 x10E3/uL  Comprehensive metabolic panel  Result Value Ref Range   Glucose 89 65 - 99 mg/dL   BUN 21 6 - 24 mg/dL   Creatinine, Ser 2.13 0.57 - 1.00 mg/dL   GFR calc non Af Amer 102 >59 mL/min/1.73   GFR calc Af Amer 118 >59 mL/min/1.73   BUN/Creatinine Ratio 36 (H) 9 - 23   Sodium 144 134 - 144 mmol/L   Potassium 4.6 3.5 - 5.2 mmol/L   Chloride 104 96 - 106 mmol/L   CO2 24 18 - 29 mmol/L   Calcium 9.4 8.7 - 10.2 mg/dL   Total Protein 7.3 6.0 - 8.5 g/dL   Albumin 4.6 3.5 - 5.5 g/dL   Globulin, Total 2.7 1.5 - 4.5 g/dL   Albumin/Globulin Ratio 1.7 1.2 - 2.2   Bilirubin Total 0.3 0.0 - 1.2 mg/dL   Alkaline Phosphatase 92 39 - 117 IU/L   AST 17 0 - 40 IU/L   ALT 13 0 - 32 IU/L  Hemoglobin A1c  Result Value Ref Range   Hgb A1c MFr Bld 5.4 4.8 - 5.6 %   Est. average glucose Bld gHb Est-mCnc 108 mg/dL  Lipid panel  Result Value Ref Range   Cholesterol, Total 188 100 - 199 mg/dL   Triglycerides 086 0 - 149 mg/dL   HDL 61 >57 mg/dL   VLDL Cholesterol Cal 27 5 - 40 mg/dL   LDL Calculated 846 (H) 0 - 99 mg/dL  Chol/HDL Ratio 3.1 0.0 - 4.4 ratio units  TSH  Result Value Ref Range   TSH 0.808 0.450 - 4.500 uIU/mL  Vitamin B12  Result Value Ref Range   Vitamin B-12 1,553 (H) 232 - 1,245 pg/mL  VITAMIN D 25 Hydroxy (Vit-D Deficiency, Fractures)  Result Value Ref Range   Vit D, 25-Hydroxy 45.8 30.0 - 100.0 ng/mL  POCT urinalysis dipstick  Result Value Ref Range   Color, UA yellow yellow   Clarity, UA clear clear   Glucose, UA negative negative   Bilirubin,  UA negative negative   Ketones, POC UA negative negative   Spec Grav, UA 1.020    Blood, UA negative negative   pH, UA 5.5    Protein Ur, POC negative negative   Urobilinogen, UA 0.2    Nitrite, UA Negative Negative   Leukocytes, UA small (1+) (A) Negative  Pap IG, CT/NG NAA, and HPV (high risk) [facility]  Result Value Ref Range   DIAGNOSIS: Comment    Specimen adequacy: Comment    Clinician Provided ICD10 Comment    Performed by: Comment    PAP Smear Comment .    Note: Comment    Test Methodology Comment    HPV, high-risk Negative Negative   Chlamydia, Nuc. Acid Amp Negative Negative   Gonococcus by Nucleic Acid Amp Negative Negative   No results found. Depression screen [facility] 2/9 10/18/2016 04/19/2016 10/13/2015 04/14/2015 10/06/2014  Decreased Interest 0 0 0 0 0  Down, Depressed, Hopeless 0 0 0 0 0  PHQ - 2 Score 0 0 0 0 0   Fall Risk  10/18/2016 04/19/2016 10/13/2015 04/14/2015  Falls in the past year? No No No No        Assessment & Plan:   1. Hypercholesteremia   2. Recurrent UTI   3. Chronic interstitial cystitis   4. Need for influenza vaccination   5. Nephrolithiasis   6. Anxiety and depression   7. Acute non-recurrent maxillary sinusitis    -chronic medical conditions controlled; obtain labs for chronic disease management; continue current medications. -new onset sinusitis; complete Amoxicillin. -s/p follow-up with Dr. Achilles Dunk for nephrolithiasis and interstitial cystitis; obtain u/a. -s/p flu vaccine in office. -emotionally doing very well; some stress with being caregiver of mother.   Orders Placed This Encounter  Procedures  . Flu Vaccine QUAD 36+ mos IM  . CBC with Differential/Platelet  . Comprehensive metabolic panel    Order Specific Question:   Has the patient fasted?    Answer:   Yes  . Lipid panel    Order Specific Question:   Has the patient fasted?    Answer:   Yes  . Urinalysis, Routine w reflex microscopic   Meds ordered this encounter   Medications  . benzonatate (TESSALON) 200 MG capsule    Sig: Take 200 mg by mouth 3 (three) times daily as needed for cough.  Marland Kitchen amoxicillin-clavulanate (AUGMENTIN) 125-31.25 MG/5ML suspension    Sig: Take by mouth 2 (two) times daily.    Return in about 6 months (around 04/20/2017) for complete physical examiniation.   Tyreece Gelles Paulita Fujita, M.D. Primary Care at [facility] previously [facility] [address] [place], [place]  [address] [phone] phone [phone] fax

## 2016-10-18 ENCOUNTER — Ambulatory Visit (INDEPENDENT_AMBULATORY_CARE_PROVIDER_SITE_OTHER): Payer: BLUE CROSS/BLUE SHIELD | Admitting: Family Medicine

## 2016-10-18 ENCOUNTER — Encounter: Payer: Self-pay | Admitting: Family Medicine

## 2016-10-18 VITALS — BP 120/81 | HR 72 | Temp 98.2°F | Resp 16 | Ht 64.76 in | Wt 141.0 lb

## 2016-10-18 DIAGNOSIS — F419 Anxiety disorder, unspecified: Secondary | ICD-10-CM | POA: Diagnosis not present

## 2016-10-18 DIAGNOSIS — F32A Depression, unspecified: Secondary | ICD-10-CM

## 2016-10-18 DIAGNOSIS — F329 Major depressive disorder, single episode, unspecified: Secondary | ICD-10-CM | POA: Diagnosis not present

## 2016-10-18 DIAGNOSIS — N390 Urinary tract infection, site not specified: Secondary | ICD-10-CM

## 2016-10-18 DIAGNOSIS — N3010 Interstitial cystitis (chronic) without hematuria: Secondary | ICD-10-CM

## 2016-10-18 DIAGNOSIS — E7800 Pure hypercholesterolemia, unspecified: Secondary | ICD-10-CM

## 2016-10-18 DIAGNOSIS — J0100 Acute maxillary sinusitis, unspecified: Secondary | ICD-10-CM

## 2016-10-18 DIAGNOSIS — Z23 Encounter for immunization: Secondary | ICD-10-CM

## 2016-10-18 DIAGNOSIS — N200 Calculus of kidney: Secondary | ICD-10-CM | POA: Diagnosis not present

## 2016-10-18 NOTE — Patient Instructions (Signed)
     IF you received an x-ray today, you will receive an invoice from [facility]. Please contact [facility] at [phone] with questions or concerns regarding your invoice.   IF you received labwork today, you will receive an invoice from [facility]. Please contact [facility] at [phone] with questions or concerns regarding your invoice.   Our billing staff will not be able to assist you with questions regarding bills from these companies.  You will be contacted with the lab results as soon as they are available. The fastest way to get your results is to activate your My Chart account. Instructions are located on the last page of this paperwork. If you have not heard from us regarding the results in 2 weeks, please contact this office.     

## 2016-10-19 LAB — COMPREHENSIVE METABOLIC PANEL
ALT: 14 IU/L (ref 0–32)
AST: 20 IU/L (ref 0–40)
Albumin/Globulin Ratio: 1.7 (ref 1.2–2.2)
Albumin: 4.7 g/dL (ref 3.5–5.5)
Alkaline Phosphatase: 92 IU/L (ref 39–117)
BUN/Creatinine Ratio: 25 — ABNORMAL HIGH (ref 9–23)
BUN: 16 mg/dL (ref 6–24)
Bilirubin Total: 0.3 mg/dL (ref 0.0–1.2)
CO2: 24 mmol/L (ref 20–29)
Calcium: 9.6 mg/dL (ref 8.7–10.2)
Chloride: 103 mmol/L (ref 96–106)
Creatinine, Ser: 0.65 mg/dL (ref 0.57–1.00)
GFR calc Af Amer: 114 mL/min/{1.73_m2} (ref >59)
GFR calc non Af Amer: 99 mL/min/{1.73_m2} (ref >59)
Globulin, Total: 2.7 g/dL (ref 1.5–4.5)
Glucose: 85 mg/dL (ref 65–99)
Potassium: 4.5 mmol/L (ref 3.5–5.2)
Sodium: 141 mmol/L (ref 134–144)
Total Protein: 7.4 g/dL (ref 6.0–8.5)

## 2016-10-19 LAB — URINALYSIS, ROUTINE W REFLEX MICROSCOPIC
Bilirubin, UA: NEGATIVE
Glucose, UA: NEGATIVE
Ketones, UA: NEGATIVE
Nitrite, UA: NEGATIVE
Protein, UA: NEGATIVE
RBC, UA: NEGATIVE
Specific Gravity, UA: 1.025 (ref 1.005–1.030)
Urobilinogen, Ur: 0.2 mg/dL (ref 0.2–1.0)
pH, UA: 5 (ref 5.0–7.5)

## 2016-10-19 LAB — CBC WITH DIFFERENTIAL/PLATELET
Basophils Absolute: 0 10*3/uL (ref 0.0–0.2)
Basos: 1 %
EOS (ABSOLUTE): 0.1 10*3/uL (ref 0.0–0.4)
Eos: 2 %
Hematocrit: 42.4 % (ref 34.0–46.6)
Hemoglobin: 14 g/dL (ref 11.1–15.9)
Immature Grans (Abs): 0 10*3/uL (ref 0.0–0.1)
Immature Granulocytes: 0 %
Lymphocytes Absolute: 2.1 10*3/uL (ref 0.7–3.1)
Lymphs: 41 %
MCH: 32.1 pg (ref 26.6–33.0)
MCHC: 33 g/dL (ref 31.5–35.7)
MCV: 97 fL (ref 79–97)
Monocytes Absolute: 0.4 10*3/uL (ref 0.1–0.9)
Monocytes: 8 %
Neutrophils Absolute: 2.4 10*3/uL (ref 1.4–7.0)
Neutrophils: 48 %
Platelets: 239 10*3/uL (ref 150–379)
RBC: 4.36 x10E6/uL (ref 3.77–5.28)
RDW: 12.9 % (ref 12.3–15.4)
WBC: 5 10*3/uL (ref 3.4–10.8)

## 2016-10-19 LAB — MICROSCOPIC EXAMINATION
Bacteria, UA: NONE SEEN
Casts: NONE SEEN /lpf

## 2016-10-19 LAB — LIPID PANEL
Chol/HDL Ratio: 2.8 ratio (ref 0.0–4.4)
Cholesterol, Total: 170 mg/dL (ref 100–199)
HDL: 61 mg/dL (ref >39)
LDL Calculated: 84 mg/dL (ref 0–99)
Triglycerides: 125 mg/dL (ref 0–149)
VLDL Cholesterol Cal: 25 mg/dL (ref 5–40)

## 2017-02-27 LAB — HM MAMMOGRAPHY

## 2017-03-03 ENCOUNTER — Encounter: Payer: Self-pay | Admitting: Family Medicine

## 2017-04-05 NOTE — Progress Notes (Signed)
Subjective:    Patient ID: Cynthia Richards, female    DOB: [birth year], 59 y.o.   MRN: [id number]  04/10/2017  Annual Exam    HPI This 56 y.o. female presents for Complete Physical Examination.  Last physical: 04-19-2016 Pap smear:  04-19-2016 WNL; HPV negative. Mammogram:  02-27-2017 Colonoscopy:  2011; no polyps Eye exam: 2019; normal; Cynthia Richards Cynthia Richards Dental exam:  Cynthia Richards; [place] every six months.   Visual Acuity Screening   Right eye Left eye Both eyes  Without correction:     With correction: 20/20 20/20 20/20     BP Readings from Last 3 Encounters:  04/10/17 112/72  10/18/16 120/81  10/13/15 114/70   Wt Readings from Last 3 Encounters:  04/10/17 144 lb (65.3 kg)  10/18/16 141 lb (64 kg)  10/13/15 139 lb (63 kg)   Immunization History  Administered Date(s) Administered  . DTaP 02/26/2009  . Influenza,inj,Quad PF,6+ Mos 10/18/2016  . Influenza-Unspecified 10/23/2014, 11/28/2015  . Tdap 04/14/2015   Recurrent vaginal odor with discharge: recurrent issue for patient; not sure of etiology.  Same sexual partner for past five years..  No history of HSV outbreaks.    Recurrent UTIs: followed by Cynthia Richards.   Review of Systems  Constitutional: Negative for activity change, appetite change, chills, diaphoresis, fatigue, fever and unexpected weight change.  HENT: Negative for congestion, dental problem, drooling, ear discharge, ear pain, facial swelling, hearing loss, mouth sores, nosebleeds, postnasal drip, rhinorrhea, sinus pressure, sneezing, sore throat, tinnitus, trouble swallowing and voice change.   Eyes: Negative for photophobia, pain, discharge, redness, itching and visual disturbance.  Respiratory: Negative for apnea, cough, choking, chest tightness, shortness of breath, wheezing and stridor.   Cardiovascular: Negative for chest pain, palpitations and leg swelling.  Gastrointestinal: Negative for abdominal distention, abdominal pain, anal bleeding,  blood in stool, constipation, diarrhea, nausea, rectal pain and vomiting.  Endocrine: Negative for cold intolerance, heat intolerance, polydipsia, polyphagia and polyuria.  Genitourinary: Positive for vaginal discharge. Negative for decreased urine volume, difficulty urinating, dyspareunia, dysuria, enuresis, flank pain, frequency, genital sores, hematuria, menstrual problem, pelvic pain, urgency, vaginal bleeding and vaginal pain.  Musculoskeletal: Negative for arthralgias, back pain, gait problem, joint swelling, myalgias, neck pain and neck stiffness.  Skin: Negative for color change, pallor, rash and wound.  Allergic/Immunologic: Negative for environmental allergies, food allergies and immunocompromised state.  Neurological: Negative for dizziness, tremors, seizures, syncope, facial asymmetry, speech difficulty, weakness, light-headedness, numbness and headaches.  Hematological: Negative for adenopathy. Does not bruise/bleed easily.  Psychiatric/Behavioral: Negative for agitation, behavioral problems, confusion, decreased concentration, dysphoric mood, hallucinations, self-injury, sleep disturbance and suicidal ideas. The patient is not nervous/anxious and is not hyperactive.     Past Medical History:  Diagnosis Date  . Anxiety   . Depression   . Genital HSV   . Hyperlipidemia   . Kidney stones   . Lichen sclerosus    Past Surgical History:  Procedure Laterality Date  . BREAST BIOPSY Right 07/1995   Core biopsy-benign fibrocystic disease  . FOOT SURGERY Bilateral    bunions  . KNEE ARTHROSCOPY Right   . LITHOTRIPSY  10/17/10  . TONSILLECTOMY     Allergies  Allergen Reactions  . Codeine Nausea And Vomiting  . Sulfa Antibiotics Rash  . Sulfacetamide Sodium Rash   Current Outpatient Medications on File Prior to Visit  Medication Sig Dispense Refill  . Biotin 3 MG TABS Take by mouth.    . clobetasol cream (TEMOVATE) 0.05 % Apply 1  application topically 2 (two) times daily. 45 g  1  . Diphenhydramine-APAP, sleep, (TYLENOL PM EXTRA STRENGTH) 50-1000 MG/30ML LIQD Take by mouth.    . docusate sodium (COLACE) 100 MG capsule Take by mouth.    Andrey Campanile VAGINAL 0.1 MG/GM vaginal cream     . Fish Oil-Cholecalciferol (FISH OIL + D3 PO) Take by mouth.    . Multiple Vitamin tablet Take by mouth.    . nystatin ointment (MYCOSTATIN) Apply 1 application topically 2 (two) times daily. 30 g 0  . triamcinolone cream (KENALOG) 0.1 % Apply 1 application topically 2 (two) times daily. 30 g 2   No current facility-administered medications on file prior to visit.    Social History   Socioeconomic History  . Marital status: Divorced    Spouse name: Not on file  . Number of children: 2  . Years of education: Not on file  . Highest education level: Not on file  Social Needs  . Financial resource strain: Not on file  . Food insecurity - worry: Not on file  . Food insecurity - inability: Not on file  . Transportation needs - medical: Not on file  . Transportation needs - non-medical: Not on file  Occupational History  . Occupation: Land  Tobacco Use  . Smoking status: Never Smoker  . Smokeless tobacco: Never Used  Substance and Sexual Activity  . Alcohol use: No  . Drug use: No  . Sexual activity: Not Currently    Birth control/protection: Post-menopausal  Other Topics Concern  . Not on file  Social History Narrative   Marital status: divorced in 2007 after 18 years of marriage due to husband's alcoholism; dating in 2019 for two years;  Younger boyfriend age 11 yo. Eric.      Children: 2 daughters; no grandchildren      Lives: alone; daughters in [place] and [place]      Employment: [employer] since 2011 in [place] next to [place].      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      Seatbelt: 100%; no texting   Family History  Problem Relation Age of Onset  . Arthritis Mother   .  Hyperlipidemia Mother   . Fibromyalgia Mother   . Osteoporosis Mother   . Lupus Mother   . Stroke Mother   . Brain cancer Father   . Diabetes Father   . Hypertension Father   . Cancer Father 45       brain cancer  . Heart disease Maternal Grandmother   . Hypertension Maternal Grandmother        Objective:    BP 112/72   Pulse 69   Temp 98 F (36.7 C) (Oral)   Resp 16   Ht 5' 4.76" (1.645 m)   Wt 144 lb (65.3 kg)   SpO2 99%   BMI 24.14 kg/m  Physical Exam  Constitutional: She is oriented to person, place, and time. She appears well-developed and well-nourished. No distress.  HENT:  Head: Normocephalic and atraumatic.  Right Ear: External ear normal.  Left Ear: External ear normal.  Nose: Nose normal.  Mouth/Throat: Oropharynx is clear and moist.  Eyes: Conjunctivae and EOM are normal. Pupils are equal, round, and reactive to light.  Neck: Normal range of motion. Neck supple. Carotid bruit is not present. No thyromegaly present.  Cardiovascular: Normal rate, regular  rhythm, normal heart sounds and intact distal pulses. Exam reveals no gallop and no friction rub.  No murmur heard. Pulmonary/Chest: Effort normal and breath sounds normal. She has no wheezes. She has no rales.  Abdominal: Soft. Bowel sounds are normal. She exhibits no distension and no mass. There is no tenderness. There is no rebound and no guarding.  Genitourinary: Uterus normal. There is no rash, tenderness, lesion or injury on the right labia. There is no rash, tenderness, lesion or injury on the left labia. Cervix exhibits no motion tenderness, no discharge and no friability. Right adnexum displays no mass, no tenderness and no fullness. Left adnexum displays no mass, no tenderness and no fullness. Vaginal discharge found.  Lymphadenopathy:    She has no cervical adenopathy.  Neurological: She is alert and oriented to person, place, and time. No cranial nerve deficit. She exhibits normal muscle tone.  Coordination normal.  Skin: Skin is warm and dry. No rash noted. She is not diaphoretic. No erythema. No pallor.  Psychiatric: She has a normal mood and affect. Her behavior is normal. Thought content normal.   No results found. [facility] 2/9 04/10/2017 10/18/2016 04/19/2016 10/13/2015 04/14/2015  Decreased Interest 0 0 0 0 0  Down, Depressed, Hopeless 0 0 0 0 0  PHQ - 2 Score 0 0 0 0 0   Fall Risk  04/10/2017 10/18/2016 04/19/2016 10/13/2015 04/14/2015  Falls in the past year? No No No No No        Assessment & Plan:   1. Routine physical examination   2. Glucose intolerance (impaired glucose tolerance)   3. Hypercholesteremia   4. Anxiety and depression   5. Vaginal discharge   6. Recurrent UTI     -anticipatory guidance provided --- exercise, weight loss, safe driving practices, three servings of dairy daily. -obtain age appropriate screening labs and labs for chronic disease management. -New onset vaginal discharge that is actually chronic; send wet prep; returned with clue cells consistent with bacterial vaginosis.  Send in Metrogel to pharmacy. Refills provided for recurrence. -anxiety and depression stable. -recent UTI; followed by [facility]; send repeat urine culture.  Orders Placed This Encounter  Procedures  . Urine Culture  . CBC with Differential/Platelet  . Comprehensive metabolic panel    Order Specific Question:   Has the patient fasted?    Answer:   No  . Lipid panel    Order Specific Question:   Has the patient fasted?    Answer:   No  . Hemoglobin A1c  . TSH  . POCT urinalysis dipstick  . POCT Wet + KOH Prep   Meds ordered this encounter  Medications  . citalopram (CELEXA) 20 MG tablet    Sig: Take 1 tablet (20 mg total) by mouth daily.    Dispense:  90 tablet    Refill:  3  . simvastatin (ZOCOR) 40 MG tablet    Sig: Take 1 tablet (40 mg total) by mouth at bedtime.    Dispense:  90 tablet    Refill:  3  . valACYclovir (VALTREX) 500 MG  tablet    Sig: Take 1 tablet (500 mg total) by mouth 2 (two) times daily.    Dispense:  180 tablet    Refill:  3  . metroNIDAZOLE (METROGEL) 0.75 % vaginal gel    Sig: Place 1 Applicatorful vaginally 2 (two) times daily.    Dispense:  70 g    Refill:  3    Return in about  6 months (around 10/08/2017) for follow-up chronic medical conditions.   Kristi Paulita Fujita, M.D. Primary Care at [facility] previously [facility] [address] [place], [place]  [address] [phone] phone [phone] fax

## 2017-04-10 ENCOUNTER — Encounter: Payer: Self-pay | Admitting: Family Medicine

## 2017-04-10 ENCOUNTER — Other Ambulatory Visit: Payer: Self-pay

## 2017-04-10 ENCOUNTER — Ambulatory Visit (INDEPENDENT_AMBULATORY_CARE_PROVIDER_SITE_OTHER): Payer: BLUE CROSS/BLUE SHIELD | Admitting: Family Medicine

## 2017-04-10 VITALS — BP 112/72 | HR 69 | Temp 98.0°F | Resp 16 | Ht 64.76 in | Wt 144.0 lb

## 2017-04-10 DIAGNOSIS — F419 Anxiety disorder, unspecified: Secondary | ICD-10-CM

## 2017-04-10 DIAGNOSIS — F329 Major depressive disorder, single episode, unspecified: Secondary | ICD-10-CM

## 2017-04-10 DIAGNOSIS — R7302 Impaired glucose tolerance (oral): Secondary | ICD-10-CM | POA: Diagnosis not present

## 2017-04-10 DIAGNOSIS — N898 Other specified noninflammatory disorders of vagina: Secondary | ICD-10-CM

## 2017-04-10 DIAGNOSIS — E7800 Pure hypercholesterolemia, unspecified: Secondary | ICD-10-CM

## 2017-04-10 DIAGNOSIS — Z0000 Encounter for general adult medical examination without abnormal findings: Secondary | ICD-10-CM

## 2017-04-10 DIAGNOSIS — F32A Depression, unspecified: Secondary | ICD-10-CM

## 2017-04-10 DIAGNOSIS — N390 Urinary tract infection, site not specified: Secondary | ICD-10-CM

## 2017-04-10 LAB — POCT URINALYSIS DIP (MANUAL ENTRY)
Bilirubin, UA: NEGATIVE
Blood, UA: NEGATIVE
Glucose, UA: NEGATIVE mg/dL
Ketones, POC UA: NEGATIVE mg/dL
Nitrite, UA: NEGATIVE
Protein Ur, POC: NEGATIVE mg/dL
Spec Grav, UA: 1.025 (ref 1.010–1.025)
Urobilinogen, UA: 0.2 E.U./dL
pH, UA: 6 (ref 5.0–8.0)

## 2017-04-10 LAB — POCT WET + KOH PREP
Trich by wet prep: ABSENT
Yeast by KOH: ABSENT
Yeast by wet prep: ABSENT

## 2017-04-10 MED ORDER — VALACYCLOVIR HCL 500 MG PO TABS: 500.0000 mg | ORAL_TABLET | Freq: Two times a day (BID) | ORAL | 3 refills | Status: AC

## 2017-04-10 MED ORDER — CITALOPRAM HYDROBROMIDE 20 MG PO TABS: 20.0000 mg | ORAL_TABLET | Freq: Every day | ORAL | 3 refills | Status: AC

## 2017-04-10 MED ORDER — SIMVASTATIN 40 MG PO TABS: 40.0000 mg | ORAL_TABLET | Freq: Every day | ORAL | 3 refills | Status: AC

## 2017-04-10 NOTE — Patient Instructions (Addendum)
IF you received an x-ray today, you will receive an invoice from [facility]. Please contact [facility] at [phone] with questions or concerns regarding your invoice.   IF you received labwork today, you will receive an invoice from [facility]. Please contact [facility] at [phone] with questions or concerns regarding your invoice.   Our billing staff will not be able to assist you with questions regarding bills from these companies.  You will be contacted with the lab results as soon as they are available. The fastest way to get your results is to activate your My Chart account. Instructions are located on the last page of this paperwork. If you have not heard from [place] regarding the results in 2 weeks, please contact this office.      Preventive Care 40-64 Years, Female Preventive care refers to lifestyle choices and visits with your health care provider that can promote health and wellness. What does preventive care include?  A yearly physical exam. This is also called an annual well check.  Dental exams once or twice a year.  Routine eye exams. Ask your health care provider how often you should have your eyes checked.  Personal lifestyle choices, including: ? Daily care of your teeth and gums. ? Regular physical activity. ? Eating a healthy diet. ? Avoiding tobacco and drug use. ? Limiting alcohol use. ? Practicing safe sex. ? Taking low-dose aspirin daily starting at age 34. ? Taking vitamin and mineral supplements as recommended by your health care provider. What happens during an annual well check? The services and screenings done by your health care provider during your annual well check will depend on your age, overall health, lifestyle risk factors, and family history of disease. Counseling Your health care provider may ask you questions about your:  Alcohol use.  Tobacco use.  Drug use.  Emotional well-being.  Home and relationship  well-being.  Sexual activity.  Eating habits.  Work and work Statistician.  Method of birth control.  Menstrual cycle.  Pregnancy history.  Screening You may have the following tests or measurements:  Height, weight, and BMI.  Blood pressure.  Lipid and cholesterol levels. These may be checked every 5 years, or more frequently if you are over 65 years old.  Skin check.  Lung cancer screening. You may have this screening every year starting at age 102 if you have a 30-pack-year history of smoking and currently smoke or have quit within the past 15 years.  Fecal occult blood test (FOBT) of the stool. You may have this test every year starting at age 33.  Flexible sigmoidoscopy or colonoscopy. You may have a sigmoidoscopy every 5 years or a colonoscopy every 10 years starting at age 23.  Hepatitis C blood test.  Hepatitis B blood test.  Sexually transmitted disease (STD) testing.  Diabetes screening. This is done by checking your blood sugar (glucose) after you have not eaten for a while (fasting). You may have this done every 1-3 years.  Mammogram. This may be done every 1-2 years. Talk to your health care provider about when you should start having regular mammograms. This may depend on whether you have a family history of breast cancer.  BRCA-related cancer screening. This may be done if you have a family history of breast, ovarian, tubal, or peritoneal cancers.  Pelvic exam and Pap test. This may be done every 3 years starting at age 3. Starting at age 55, this may be done every 5 years if  you have a Pap test in combination with an HPV test.  Bone density scan. This is done to screen for osteoporosis. You may have this scan if you are at high risk for osteoporosis.  Discuss your test results, treatment options, and if necessary, the need for more tests with your health care provider. Vaccines Your health care provider may recommend certain vaccines, such  as:  Influenza vaccine. This is recommended every year.  Tetanus, diphtheria, and acellular pertussis (Tdap, Td) vaccine. You may need a Td booster every 10 years.  Varicella vaccine. You may need this if you have not been vaccinated.  Zoster vaccine. You may need this after age 96.  Measles, mumps, and rubella (MMR) vaccine. You may need at least one dose of MMR if you were born in 1957 or later. You may also need a second dose.  Pneumococcal 13-valent conjugate (PCV13) vaccine. You may need this if you have certain conditions and were not previously vaccinated.  Pneumococcal polysaccharide (PPSV23) vaccine. You may need one or two doses if you smoke cigarettes or if you have certain conditions.  Meningococcal vaccine. You may need this if you have certain conditions.  Hepatitis A vaccine. You may need this if you have certain conditions or if you travel or work in places where you may be exposed to hepatitis A.  Hepatitis B vaccine. You may need this if you have certain conditions or if you travel or work in places where you may be exposed to hepatitis B.  Haemophilus influenzae type b (Hib) vaccine. You may need this if you have certain conditions.  Talk to your health care provider about which screenings and vaccines you need and how often you need them. This information is not intended to replace advice given to you by your health care provider. Make sure you discuss any questions you have with your health care provider. Document Released: 03/06/2015 Document Revised: 10/28/2015 Document Reviewed: 12/09/2014 Elsevier Interactive Patient Education  [employer].

## 2017-04-11 LAB — COMPREHENSIVE METABOLIC PANEL
ALT: 19 IU/L (ref 0–32)
AST: 20 IU/L (ref 0–40)
Albumin/Globulin Ratio: 1.9 (ref 1.2–2.2)
Albumin: 4.6 g/dL (ref 3.5–5.5)
Alkaline Phosphatase: 84 IU/L (ref 39–117)
BUN/Creatinine Ratio: 17 (ref 9–23)
BUN: 13 mg/dL (ref 6–24)
Bilirubin Total: 0.4 mg/dL (ref 0.0–1.2)
CO2: 23 mmol/L (ref 20–29)
Calcium: 9.5 mg/dL (ref 8.7–10.2)
Chloride: 102 mmol/L (ref 96–106)
Creatinine, Ser: 0.78 mg/dL (ref 0.57–1.00)
GFR calc Af Amer: 97 mL/min/{1.73_m2} (ref >59)
GFR calc non Af Amer: 84 mL/min/{1.73_m2} (ref >59)
Globulin, Total: 2.4 g/dL (ref 1.5–4.5)
Glucose: 89 mg/dL (ref 65–99)
Potassium: 4.5 mmol/L (ref 3.5–5.2)
Sodium: 141 mmol/L (ref 134–144)
Total Protein: 7 g/dL (ref 6.0–8.5)

## 2017-04-11 LAB — CBC WITH DIFFERENTIAL/PLATELET
Basophils Absolute: 0 10*3/uL (ref 0.0–0.2)
Basos: 1 %
EOS (ABSOLUTE): 0.1 10*3/uL (ref 0.0–0.4)
Eos: 2 %
Hematocrit: 42.2 % (ref 34.0–46.6)
Hemoglobin: 14.1 g/dL (ref 11.1–15.9)
Immature Grans (Abs): 0 10*3/uL (ref 0.0–0.1)
Immature Granulocytes: 0 %
Lymphocytes Absolute: 2.1 10*3/uL (ref 0.7–3.1)
Lymphs: 43 %
MCH: 32.2 pg (ref 26.6–33.0)
MCHC: 33.4 g/dL (ref 31.5–35.7)
MCV: 96 fL (ref 79–97)
Monocytes Absolute: 0.4 10*3/uL (ref 0.1–0.9)
Monocytes: 8 %
Neutrophils Absolute: 2.3 10*3/uL (ref 1.4–7.0)
Neutrophils: 46 %
Platelets: 231 10*3/uL (ref 150–379)
RBC: 4.38 x10E6/uL (ref 3.77–5.28)
RDW: 13 % (ref 12.3–15.4)
WBC: 4.8 10*3/uL (ref 3.4–10.8)

## 2017-04-11 LAB — HEMOGLOBIN A1C
Est. average glucose Bld gHb Est-mCnc: 114 mg/dL
Hgb A1c MFr Bld: 5.6 % (ref 4.8–5.6)

## 2017-04-11 LAB — TSH: TSH: 0.948 u[IU]/mL (ref 0.450–4.500)

## 2017-04-11 LAB — LIPID PANEL
Chol/HDL Ratio: 2.9 ratio (ref 0.0–4.4)
Cholesterol, Total: 183 mg/dL (ref 100–199)
HDL: 63 mg/dL (ref >39)
LDL Calculated: 85 mg/dL (ref 0–99)
Triglycerides: 177 mg/dL — ABNORMAL HIGH (ref 0–149)
VLDL Cholesterol Cal: 35 mg/dL (ref 5–40)

## 2017-04-12 LAB — URINE CULTURE

## 2017-04-14 ENCOUNTER — Telehealth: Payer: Self-pay | Admitting: Family Medicine

## 2017-04-14 NOTE — Telephone Encounter (Signed)
Copied from CRM [id number]. Topic: Quick Communication - See Telephone Encounter >> Apr 14, 2017  1:26 PM Diana EvesHoyt, Maryann B wrote: CRM for notification. See Telephone encounter for:  Pt inquiring about lab work on 04/10/17. She is hoping to get results before the weekend.  04/14/17.

## 2017-04-18 ENCOUNTER — Encounter: Payer: Self-pay | Admitting: Family Medicine

## 2017-04-18 ENCOUNTER — Encounter: Payer: BLUE CROSS/BLUE SHIELD | Admitting: Family Medicine

## 2017-04-18 MED ORDER — METRONIDAZOLE 0.75 % VA GEL: 1.0000 | Freq: Two times a day (BID) | VAGINAL | 3 refills | Status: DC

## 2017-04-25 NOTE — Telephone Encounter (Signed)
Lab results released to MyChart, patient viewed. Closing encounter.

## 2017-05-02 ENCOUNTER — Ambulatory Visit: Payer: Self-pay | Admitting: *Deleted

## 2017-05-02 NOTE — Telephone Encounter (Signed)
Patient is calling to report she is having a reaction to the gel she was Rx'd for BV.  Reason for Disposition . Caller requesting a NON-URGENT new prescription or refill and triager unable to refill per unit policy  Answer Assessment - Initial Assessment Questions 1. SYMPTOMS: "Do you have any symptoms?"     Burns on inside when she uses the gel. She has pain and cramping in abdomen also.  2. SEVERITY: If symptoms are present, ask "Are they mild, moderate or severe?"     Patient has stopped using-  Patient used gel- am/pm for 3 days- she has stopped. She states she still has the greenish discharge. She is requesting alternative treatment.  May leave a message on her cell # [phone]  Protocols used: MEDICATION QUESTION CALL-A-AH

## 2017-05-05 NOTE — Telephone Encounter (Signed)
Oneal GroutSebastian, Jennifer S Communication 05/04/2017 02:22 PM    Reason for CRM: checking status of triage note from 05/02/17

## 2017-05-07 MED ORDER — METRONIDAZOLE 500 MG PO TABS: 500.0000 mg | ORAL_TABLET | Freq: Two times a day (BID) | ORAL | 3 refills | Status: DC

## 2017-05-07 NOTE — Telephone Encounter (Signed)
Sent in Metronidazole pills. Please advise patient.

## 2017-05-07 NOTE — Addendum Note (Signed)
Addended by: Ethelda ChickSMITH, Keirstin Musil M on: 05/07/2017 08:12 PM   Modules accepted: Orders

## 2017-05-08 ENCOUNTER — Other Ambulatory Visit: Payer: Self-pay

## 2017-05-08 ENCOUNTER — Ambulatory Visit (INDEPENDENT_AMBULATORY_CARE_PROVIDER_SITE_OTHER): Payer: BLUE CROSS/BLUE SHIELD | Admitting: Family Medicine

## 2017-05-08 ENCOUNTER — Encounter: Payer: Self-pay | Admitting: Family Medicine

## 2017-05-08 VITALS — BP 122/72 | HR 76 | Temp 98.0°F | Resp 16 | Ht 64.76 in | Wt 146.8 lb

## 2017-05-08 DIAGNOSIS — R102 Pelvic and perineal pain: Secondary | ICD-10-CM | POA: Diagnosis not present

## 2017-05-08 DIAGNOSIS — B9689 Other specified bacterial agents as the cause of diseases classified elsewhere: Secondary | ICD-10-CM | POA: Diagnosis not present

## 2017-05-08 DIAGNOSIS — Z87442 Personal history of urinary calculi: Secondary | ICD-10-CM | POA: Diagnosis not present

## 2017-05-08 DIAGNOSIS — N760 Acute vaginitis: Secondary | ICD-10-CM

## 2017-05-08 DIAGNOSIS — R350 Frequency of micturition: Secondary | ICD-10-CM | POA: Diagnosis not present

## 2017-05-08 LAB — POCT WET + KOH PREP
Trich by wet prep: ABSENT
Yeast by KOH: ABSENT
Yeast by wet prep: ABSENT

## 2017-05-08 LAB — POCT URINALYSIS DIP (MANUAL ENTRY)
Bilirubin, UA: NEGATIVE
Glucose, UA: NEGATIVE mg/dL
Ketones, POC UA: NEGATIVE mg/dL
Nitrite, UA: NEGATIVE
Spec Grav, UA: 1.01 (ref 1.010–1.025)
Urobilinogen, UA: 0.2 E.U./dL
pH, UA: 6 (ref 5.0–8.0)

## 2017-05-08 MED ORDER — CIPROFLOXACIN HCL 500 MG PO TABS: 500.0000 mg | ORAL_TABLET | Freq: Two times a day (BID) | ORAL | 0 refills | Status: DC

## 2017-05-08 MED ORDER — METRONIDAZOLE 500 MG PO TABS: 500.0000 mg | ORAL_TABLET | Freq: Two times a day (BID) | ORAL | 3 refills | Status: DC

## 2017-05-08 MED ORDER — PHENAZOPYRIDINE HCL 100 MG PO TABS: 100.0000 mg | ORAL_TABLET | Freq: Three times a day (TID) | ORAL | 0 refills | Status: DC | PRN

## 2017-05-08 NOTE — Progress Notes (Signed)
Subjective:    Patient ID: Cynthia Richards, female    DOB: 1960-12-10, 57 y.o.   MRN: [id number]  05/08/2017  Urinary Frequency (with some pelivic pain x 1 week )    HPI This 57 y.o. female presents for evaluation of medication side effect to Metrogel with burning and pelvic pain; recent diagnosis of bacterial vaginosis.  Stopped Metrogel. Started using gel and worsened; hurting and burning; would wipe with yellow green discharge.  Recommended stopping using.  Not sure if has turned into urinary tract; feels like pressure horrible.  Developed suprapubic pressure last week.  Urinary frequency; nocturia x 1.   No fever/chills/sweats.  Does not feel good; achy lower.  No vaginal discharge.  No vaginal odor.  Vaginal discharge much improved.  Terrible.  No vaginal bleeding. Mild nausea but no vomiting.  At nighttime must curl up to decrease pain. Has not picked up Metronidazole oral yet.    BP Readings from Last 3 Encounters:  05/08/17 122/72  04/10/17 112/72  10/18/16 120/81   Wt Readings from Last 3 Encounters:  05/08/17 146 lb 12.8 oz (66.6 kg)  04/10/17 144 lb (65.3 kg)  10/18/16 141 lb (64 kg)   Immunization History  Administered Date(s) Administered  . DTaP 02/26/2009  . Influenza,inj,Quad PF,6+ Mos 10/18/2016  . Influenza-Unspecified 10/23/2014, 11/28/2015  . Tdap 04/14/2015    Review of Systems  Constitutional: Negative for chills, diaphoresis, fatigue and fever.  HENT: Negative for ear pain, postnasal drip, rhinorrhea, sinus pressure, sore throat and trouble swallowing.   Respiratory: Negative for cough and shortness of breath.   Cardiovascular: Negative for chest pain, palpitations and leg swelling.  Gastrointestinal: Negative for abdominal distention, abdominal pain, anal bleeding, blood in stool, constipation, diarrhea, nausea and vomiting.  Genitourinary: Positive for difficulty urinating, flank pain, frequency, pelvic pain and vaginal pain. Negative for decreased  urine volume, dysuria, genital sores, hematuria, menstrual problem, urgency, vaginal bleeding and vaginal discharge.    Past Medical History:  Diagnosis Date  . Anxiety   . Depression   . Genital HSV   . Hyperlipidemia   . Kidney stones   . Lichen sclerosus    Past Surgical History:  Procedure Laterality Date  . BREAST BIOPSY Right 07/1995   Core biopsy-benign fibrocystic disease  . FOOT SURGERY Bilateral    bunions  . KNEE ARTHROSCOPY Right   . LITHOTRIPSY  10/17/10  . TONSILLECTOMY     Allergies  Allergen Reactions  . Codeine Nausea And Vomiting  . Sulfa Antibiotics Rash  . Sulfacetamide Sodium Rash   Current Outpatient Medications on File Prior to Visit  Medication Sig Dispense Refill  . Biotin 3 MG TABS Take by mouth.    . citalopram (CELEXA) 20 MG tablet Take 1 tablet (20 mg total) by mouth daily. 90 tablet 3  . clobetasol cream (TEMOVATE) 0.05 % Apply 1 application topically 2 (two) times daily. 45 g 1  . Diphenhydramine-APAP, sleep, (TYLENOL PM EXTRA STRENGTH) 50-1000 MG/30ML LIQD Take by mouth.    . docusate sodium (COLACE) 100 MG capsule Take by mouth.    Andrey Campanile VAGINAL 0.1 MG/GM vaginal cream     . Fish Oil-Cholecalciferol (FISH OIL + D3 PO) Take by mouth.    . Multiple Vitamin tablet Take by mouth.    . nystatin ointment (MYCOSTATIN) Apply 1 application topically 2 (two) times daily. 30 g 0  . simvastatin (ZOCOR) 40 MG tablet Take 1 tablet (40 mg total) by mouth at bedtime. 90 tablet  3  . triamcinolone cream (KENALOG) 0.1 % Apply 1 application topically 2 (two) times daily. 30 g 2  . valACYclovir (VALTREX) 500 MG tablet Take 1 tablet (500 mg total) by mouth 2 (two) times daily. 180 tablet 3   No current facility-administered medications on file prior to visit.    Social History   Socioeconomic History  . Marital status: Divorced    Spouse name: Not on file  . Number of children: 2  . Years of education: Not on file  . Highest education level: Not on  file  Social Needs  . Financial resource strain: Not on file  . Food insecurity - worry: Not on file  . Food insecurity - inability: Not on file  . Transportation needs - medical: Not on file  . Transportation needs - non-medical: Not on file  Occupational History  . Occupation: Land  Tobacco Use  . Smoking status: Never Smoker  . Smokeless tobacco: Never Used  Substance and Sexual Activity  . Alcohol use: No  . Drug use: No  . Sexual activity: Not Currently    Birth control/protection: Post-menopausal  Other Topics Concern  . Not on file  Social History Narrative   Marital status: divorced in 2007 after 18 years of marriage due to husband's alcoholism; dating in 2019 for two years;  Younger boyfriend age 32 yo. Eric.      Children: 2 daughters; no grandchildren      Lives: alone; daughters in [place] and [place]      Employment: [employer] since 2011 in [place].      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      Seatbelt: 100%; no texting   Family History  Problem Relation Age of Onset  . Arthritis Mother   . Hyperlipidemia Mother   . Fibromyalgia Mother   . Osteoporosis Mother   . Lupus Mother   . Stroke Mother   . Brain cancer Father   . Diabetes Father   . Hypertension Father   . Cancer Father 45       brain cancer  . Heart disease Maternal Grandmother   . Hypertension Maternal Grandmother        Objective:    BP 122/72   Pulse 76   Temp 98 F (36.7 C) (Oral)   Resp 16   Ht 5' 4.76" (1.645 m)   Wt 146 lb 12.8 oz (66.6 kg)   SpO2 97%   BMI 24.61 kg/m  Physical Exam  Constitutional: She is oriented to person, place, and time. She appears well-developed and well-nourished. No distress.  HENT:  Head: Normocephalic and atraumatic.  Eyes: Conjunctivae are normal. Pupils are equal, round, and reactive to light.  Neck: Normal range of motion. Neck supple.    Cardiovascular: Normal rate, regular rhythm and normal heart sounds. Exam reveals no gallop and no friction rub.  No murmur heard. Pulmonary/Chest: Effort normal and breath sounds normal. She has no wheezes. She has no rales.  Abdominal: Soft. Bowel sounds are normal. She exhibits no distension and no mass. There is no tenderness. There is no rebound and no guarding.  Genitourinary: Vagina normal and uterus normal. There is no rash, tenderness or lesion on the right labia. There is no rash, tenderness or lesion on the left labia. No erythema in the vagina. No vaginal discharge found.  Neurological: She is alert and oriented to person, place, and time.  Skin: She is not diaphoretic.  Psychiatric: She has a normal mood and affect. Her behavior is normal.  Nursing note and vitals reviewed.  No results found. Depression screen [facility] 05/08/2017 04/10/2017 10/18/2016 04/19/2016 10/13/2015  Decreased Interest 0 0 0 0 0  Down, Depressed, Hopeless 0 0 0 0 0  PHQ - 2 Score 0 0 0 0 0   Fall Risk  05/08/2017 04/10/2017 10/18/2016 04/19/2016 10/13/2015  Falls in the past year? No No No No No    Results for orders placed or performed in visit on 05/08/17  POCT urinalysis dipstick  Result Value Ref Range   Color, UA yellow yellow   Clarity, UA cloudy (A) clear   Glucose, UA negative negative mg/dL   Bilirubin, UA negative negative   Ketones, POC UA negative negative mg/dL   Spec Grav, UA 1.6101.010 [id number] - 1.025   Blood, UA small (A) negative   pH, UA 6.0 5.0 - 8.0   Protein Ur, POC trace (A) negative mg/dL   Urobilinogen, UA 0.2 0.2 or 1.0 E.U./dL   Nitrite, UA Negative Negative   Leukocytes, UA Large (3+) (A) Negative       Assessment & Plan:   1. Urinary frequency   2. Bacterial vaginosis   3. Pelvic pain   4. History of nephrolithiasis     New onset urinary frequency: Symptoms are drastically different from previous visit.  Pelvic exam within normal limits without evidence of vaginal  discharge.  Send urine culture.  Treat empirically with Cipro while awaiting urine culture results.  Bacterial vaginosis: Nearly resolved.  Patient intolerant to vaginal metronidazole.  Vaginal discharge resolved at this time.  I have sent Metronidazole oral for recurrence in the future.  History of nephrolithiasis: Current symptoms not consistent with acute nephrolithiasis.  Current blood in urine secondary to acute cystitis.  Orders Placed This Encounter  Procedures  . Urine Culture    Order Specific Question:   Source    Answer:   clean catch  . Urine Microscopic  . POCT urinalysis dipstick  . POCT Wet + KOH Prep   Meds ordered this encounter  Medications  . ciprofloxacin (CIPRO) 500 MG tablet    Sig: Take 1 tablet (500 mg total) by mouth 2 (two) times daily.    Dispense:  14 tablet    Refill:  0  . phenazopyridine (PYRIDIUM) 100 MG tablet    Sig: Take 1-2 tablets (100-200 mg total) by mouth 3 (three) times daily as needed for pain.    Dispense:  12 tablet    Refill:  0  . metroNIDAZOLE (FLAGYL) 500 MG tablet    Sig: Take 1 tablet (500 mg total) by mouth 2 (two) times daily.    Dispense:  14 tablet    Refill:  3    No Follow-up on file.   Kristi Paulita FujitaMartin Smith, M.D. [facility]  [place] [facility] [address] [place], [place]  [id number] [phone] phone [phone] fax

## 2017-05-08 NOTE — Telephone Encounter (Signed)
Patient notified via My Chart message.

## 2017-05-08 NOTE — Patient Instructions (Addendum)
   IF you received an x-ray today, you will receive an invoice from [facility]. Please contact [facility] at [phone] with questions or concerns regarding your invoice.   IF you received labwork today, you will receive an invoice from [facility]. Please contact [facility] at [phone] with questions or concerns regarding your invoice.   Our billing staff will not be able to assist you with questions regarding bills from these companies.  You will be contacted with the lab results as soon as they are available. The fastest way to get your results is to activate your My Chart account. Instructions are located on the last page of this paperwork. If you have not heard from us regarding the results in 2 weeks, please contact this office.     Bacterial Vaginosis Bacterial vaginosis is a vaginal infection that occurs when the normal balance of bacteria in the vagina is disrupted. It results from an overgrowth of certain bacteria. This is the most common vaginal infection among women ages 15-44. Because bacterial vaginosis increases your risk for STIs (sexually transmitted infections), getting treated can help reduce your risk for chlamydia, gonorrhea, herpes, and HIV (human immunodeficiency virus). Treatment is also important for preventing complications in pregnant women, because this condition can cause an early (premature) delivery. What are the causes? This condition is caused by an increase in harmful bacteria that are normally present in small amounts in the vagina. However, the reason that the condition develops is not fully understood. What increases the risk? The following factors may make you more likely to develop this condition:  Having a new sexual partner or multiple sexual partners.  Having unprotected sex.  Douching.  Having an intrauterine device (IUD).  Smoking.  Drug and alcohol abuse.  Taking certain antibiotic medicines.  Being  pregnant.  You cannot get bacterial vaginosis from toilet seats, bedding, swimming pools, or contact with objects around you. What are the signs or symptoms? Symptoms of this condition include:  Grey or white vaginal discharge. The discharge can also be watery or foamy.  A fish-like odor with discharge, especially after sexual intercourse or during menstruation.  Itching in and around the vagina.  Burning or pain with urination.  Some women with bacterial vaginosis have no signs or symptoms. How is this diagnosed? This condition is diagnosed based on:  Your medical history.  A physical exam of the vagina.  Testing a sample of vaginal fluid under a microscope to look for a large amount of bad bacteria or abnormal cells. Your health care provider may use a cotton swab or a small wooden spatula to collect the sample.  How is this treated? This condition is treated with antibiotics. These may be given as a pill, a vaginal cream, or a medicine that is put into the vagina (suppository). If the condition comes back after treatment, a second round of antibiotics may be needed. Follow these instructions at home: Medicines  Take over-the-counter and prescription medicines only as told by your health care provider.  Take or use your antibiotic as told by your health care provider. Do not stop taking or using the antibiotic even if you start to feel better. General instructions  If you have a female sexual partner, tell her that you have a vaginal infection. She should see her health care provider and be treated if she has symptoms. If you have a female sexual partner, he does not need treatment.  During treatment: ? Avoid sexual activity until you finish treatment. ?   Do not douche. ? Avoid alcohol as directed by your health care provider. ? Avoid breastfeeding as directed by your health care provider.  Drink enough water and fluids to keep your urine clear or pale yellow.  Keep the  area around your vagina and rectum clean. ? Wash the area daily with warm water. ? Wipe yourself from front to back after using the toilet.  Keep all follow-up visits as told by your health care provider. This is important. How is this prevented?  Do not douche.  Wash the outside of your vagina with warm water only.  Use protection when having sex. This includes latex condoms and dental dams.  Limit how many sexual partners you have. To help prevent bacterial vaginosis, it is best to have sex with just one partner (monogamous).  Make sure you and your sexual partner are tested for STIs.  Wear cotton or cotton-lined underwear.  Avoid wearing tight pants and pantyhose, especially during summer.  Limit the amount of alcohol that you drink.  Do not use any products that contain nicotine or tobacco, such as cigarettes and e-cigarettes. If you need help quitting, ask your health care provider.  Do not use illegal drugs. Where to find more information:  Centers for Disease Control and Prevention: www.cdc.gov/std  American Sexual Health Association (ASHA): www.ashastd.org  U.S. Department of Health and Human Services, Office on Women's Health: www.womenshealth.gov/ or https://www.womenshealth.gov/a-z-topics/bacterial-vaginosis Contact a health care provider if:  Your symptoms do not improve, even after treatment.  You have more discharge or pain when urinating.  You have a fever.  You have pain in your abdomen.  You have pain during sex.  You have vaginal bleeding between periods. Summary  Bacterial vaginosis is a vaginal infection that occurs when the normal balance of bacteria in the vagina is disrupted.  Because bacterial vaginosis increases your risk for STIs (sexually transmitted infections), getting treated can help reduce your risk for chlamydia, gonorrhea, herpes, and HIV (human immunodeficiency virus). Treatment is also important for preventing complications in  pregnant women, because the condition can cause an early (premature) delivery.  This condition is treated with antibiotic medicines. These may be given as a pill, a vaginal cream, or a medicine that is put into the vagina (suppository). This information is not intended to replace advice given to you by your health care provider. Make sure you discuss any questions you have with your health care provider. Document Released: 02/07/2005 Document Revised: 06/13/2016 Document Reviewed: 10/24/2015 Elsevier Interactive Patient Education  2018 [employer]  

## 2017-05-09 LAB — URINALYSIS, MICROSCOPIC ONLY
Casts: NONE SEEN /lpf
Epithelial Cells (non renal): NONE SEEN /hpf (ref 0–10)
WBC, UA: >30 /hpf — AB (ref 0–?)

## 2017-05-10 LAB — URINE CULTURE

## 2017-05-12 ENCOUNTER — Encounter: Payer: Self-pay | Admitting: Family Medicine

## 2017-07-17 ENCOUNTER — Encounter: Payer: Self-pay | Admitting: Family Medicine

## 2017-10-09 ENCOUNTER — Ambulatory Visit: Payer: BLUE CROSS/BLUE SHIELD | Admitting: Family Medicine

## 2019-04-17 ENCOUNTER — Other Ambulatory Visit: Payer: Self-pay

## 2019-04-17 ENCOUNTER — Telehealth: Payer: Self-pay

## 2019-04-17 DIAGNOSIS — Z1211 Encounter for screening for malignant neoplasm of colon: Secondary | ICD-10-CM

## 2019-04-17 NOTE — Telephone Encounter (Signed)
Gastroenterology Pre-Procedure Review  Request Date: 05/10/19 Requesting Physician: Dr. Servando Snare  PATIENT REVIEW QUESTIONS: The patient responded to the following health history questions as indicated:    1. Are you having any GI issues? no 2. Do you have a personal history of Polyps? no 3. Do you have a family history of Colon Cancer or Polyps? no 4. Diabetes Mellitus? no 5. Joint replacements in the past 12 months?no 6. Major health problems in the past 3 months?no 7. Any artificial heart valves, MVP, or defibrillator?no    MEDICATIONS & ALLERGIES:    Patient reports the following regarding taking any anticoagulation/antiplatelet therapy:   Plavix, Coumadin, Eliquis, Xarelto, Lovenox, Pradaxa, Brilinta, or Effient? no Aspirin? no  Patient confirms/reports the following medications:  Current Outpatient Medications  Medication Sig Dispense Refill  . Biotin 3 MG TABS Take by mouth.    . ciprofloxacin (CIPRO) 500 MG tablet Take 1 tablet (500 mg total) by mouth 2 (two) times daily. 14 tablet 0  . citalopram (CELEXA) 20 MG tablet Take 1 tablet (20 mg total) by mouth daily. 90 tablet 3  . clobetasol cream (TEMOVATE) 0.05 % Apply 1 application topically 2 (two) times daily. 45 g 1  . Diphenhydramine-APAP, sleep, (TYLENOL PM EXTRA STRENGTH) 50-1000 MG/30ML LIQD Take by mouth.    . docusate sodium (COLACE) 100 MG capsule Take by mouth.    Andrey Campanile VAGINAL 0.1 MG/GM vaginal cream     . Fish Oil-Cholecalciferol (FISH OIL + D3 PO) Take by mouth.    . metroNIDAZOLE (FLAGYL) 500 MG tablet Take 1 tablet (500 mg total) by mouth 2 (two) times daily. 14 tablet 3  . Multiple Vitamin tablet Take by mouth.    . nystatin ointment (MYCOSTATIN) Apply 1 application topically 2 (two) times daily. 30 g 0  . phenazopyridine (PYRIDIUM) 100 MG tablet Take 1-2 tablets (100-200 mg total) by mouth 3 (three) times daily as needed for pain. 12 tablet 0  . simvastatin (ZOCOR) 40 MG tablet Take 1 tablet (40 mg total) by  mouth at bedtime. 90 tablet 3  . triamcinolone cream (KENALOG) 0.1 % Apply 1 application topically 2 (two) times daily. 30 g 2  . valACYclovir (VALTREX) 500 MG tablet Take 1 tablet (500 mg total) by mouth 2 (two) times daily. 180 tablet 3   No current facility-administered medications for this visit.    Patient confirms/reports the following allergies:  Allergies  Allergen Reactions  . Codeine Nausea And Vomiting  . Sulfa Antibiotics Rash  . Sulfacetamide Sodium Rash    No orders of the defined types were placed in this encounter.   AUTHORIZATION INFORMATION Primary Insurance: 1D#: Group #:  Secondary Insurance: 1D#: Group #:  SCHEDULE INFORMATION: Date: 05/08/19 Time: Location:[facility]

## 2019-05-08 ENCOUNTER — Other Ambulatory Visit
Admission: RE | Admit: 2019-05-08 | Discharge: 2019-05-08 | Disposition: A | Discharge status: Home / Self Care | Payer: 59 | Source: Ambulatory Visit | Attending: Gastroenterology | Admitting: Gastroenterology

## 2019-05-08 ENCOUNTER — Other Ambulatory Visit: Payer: Self-pay

## 2019-05-08 DIAGNOSIS — Z20822 Contact with and (suspected) exposure to covid-19: Secondary | ICD-10-CM | POA: Diagnosis not present

## 2019-05-08 DIAGNOSIS — Z01812 Encounter for preprocedural laboratory examination: Secondary | ICD-10-CM | POA: Diagnosis present

## 2019-05-08 LAB — SARS CORONAVIRUS 2 (TAT 6-24 HRS): SARS Coronavirus 2: NEGATIVE

## 2019-05-10 ENCOUNTER — Encounter
Admission: RE | Disposition: A | Discharge status: Home / Self Care | Payer: Self-pay | Source: Ambulatory Visit | Attending: Gastroenterology

## 2019-05-10 ENCOUNTER — Encounter: Payer: Self-pay | Admitting: Gastroenterology

## 2019-05-10 ENCOUNTER — Ambulatory Visit: Payer: 59 | Admitting: Family

## 2019-05-10 ENCOUNTER — Ambulatory Visit
Admission: RE | Admit: 2019-05-10 | Discharge: 2019-05-10 | Disposition: A | Discharge status: Home / Self Care | Payer: 59 | Source: Ambulatory Visit | Attending: Gastroenterology | Admitting: Gastroenterology

## 2019-05-10 ENCOUNTER — Other Ambulatory Visit: Payer: Self-pay

## 2019-05-10 DIAGNOSIS — F329 Major depressive disorder, single episode, unspecified: Secondary | ICD-10-CM | POA: Diagnosis not present

## 2019-05-10 DIAGNOSIS — E7800 Pure hypercholesterolemia, unspecified: Secondary | ICD-10-CM | POA: Insufficient documentation

## 2019-05-10 DIAGNOSIS — Z79899 Other long term (current) drug therapy: Secondary | ICD-10-CM | POA: Insufficient documentation

## 2019-05-10 DIAGNOSIS — Z87442 Personal history of urinary calculi: Secondary | ICD-10-CM | POA: Insufficient documentation

## 2019-05-10 DIAGNOSIS — K5730 Diverticulosis of large intestine without perforation or abscess without bleeding: Secondary | ICD-10-CM | POA: Insufficient documentation

## 2019-05-10 DIAGNOSIS — F419 Anxiety disorder, unspecified: Secondary | ICD-10-CM | POA: Insufficient documentation

## 2019-05-10 DIAGNOSIS — E785 Hyperlipidemia, unspecified: Secondary | ICD-10-CM | POA: Diagnosis not present

## 2019-05-10 DIAGNOSIS — Z1211 Encounter for screening for malignant neoplasm of colon: Secondary | ICD-10-CM | POA: Diagnosis present

## 2019-05-10 DIAGNOSIS — K648 Other hemorrhoids: Secondary | ICD-10-CM | POA: Insufficient documentation

## 2019-05-10 HISTORY — PX: COLONOSCOPY WITH PROPOFOL: SHX5780

## 2019-05-10 SURGERY — COLONOSCOPY WITH PROPOFOL
Anesthesia: General

## 2019-05-10 MED ORDER — PROPOFOL 10 MG/ML IV BOLUS
INTRAVENOUS | Status: DC | PRN
  Administered 2019-05-10: 120 ug/kg/min via INTRAVENOUS
  Administered 2019-05-10: 50 mg via INTRAVENOUS

## 2019-05-10 MED ORDER — SODIUM CHLORIDE 0.9 % IV SOLN: INTRAVENOUS | Status: DC

## 2019-05-10 MED ORDER — PROPOFOL 500 MG/50ML IV EMUL
INTRAVENOUS | Status: AC
  Filled 2019-05-10: qty 50

## 2019-05-10 NOTE — Anesthesia Postprocedure Evaluation (Signed)
Anesthesia Post Note  Patient: Cynthia Richards  Procedure(s) Performed: COLONOSCOPY WITH PROPOFOL (N/A )  Patient location during evaluation: Endoscopy Anesthesia Type: General Level of consciousness: awake and alert and oriented Pain management: pain level controlled Vital Signs Assessment: post-procedure vital signs reviewed and stable Respiratory status: spontaneous breathing Cardiovascular status: blood pressure returned to baseline Anesthetic complications: no     Last Vitals:  Vitals:   05/10/19 1115 05/10/19 1125  BP: 95/67 101/79  Pulse: 69 73  Resp: 18 20  Temp:    SpO2: 96% 96%    Last Pain:  Vitals:   05/10/19 1125  TempSrc:   PainSc: 0-No pain                 Jony Ladnier

## 2019-05-10 NOTE — Transfer of Care (Signed)
Immediate Anesthesia Transfer of Care Note  Patient: Cynthia Richards  Procedure(s) Performed: COLONOSCOPY WITH PROPOFOL (N/A )  Patient Location: PACU  Anesthesia Type:General  Level of Consciousness: drowsy  Airway & Oxygen Therapy: Patient Spontanous Breathing  Post-op Assessment: Report given to RN and Post -op Vital signs reviewed and stable  Post vital signs: Reviewed and stable  Last Vitals:  Vitals Value Taken Time  BP    Temp    Pulse    Resp    SpO2      Last Pain:  Vitals:   05/10/19 1011  TempSrc: Temporal  PainSc: 0-No pain         Complications: No apparent anesthesia complications

## 2019-05-10 NOTE — Op Note (Signed)
[facility] Gastroenterology Patient Name: Cynthia Richards Procedure Date: 05/10/2019 10:43 AM MRN: [id number] Account #: [id number] Date of Birth: 09/25/1960 Admit Type: Outpatient Age: 59 Room: [facility] ENDO ROOM 4 Gender: Female Note Status: Finalized Procedure:             Colonoscopy Indications:           Screening for colorectal malignant neoplasm Providers:             Midge Minium MD, MD Referring MD:          Myrle Sheng. Katrinka Blazing, MD (Referring MD) Medicines:             Propofol per Anesthesia Complications:         No immediate complications. Procedure:             Pre-Anesthesia Assessment:                        - Prior to the procedure, a History and Physical was                         performed, and patient medications and allergies were                         reviewed. The patient's tolerance of previous                         anesthesia was also reviewed. The risks and benefits                         of the procedure and the sedation options and risks                         were discussed with the patient. All questions were                         answered, and informed consent was obtained. Prior                         Anticoagulants: The patient has taken no previous                         anticoagulant or antiplatelet agents. ASA Grade                         Assessment: II - A patient with mild systemic disease.                         After reviewing the risks and benefits, the patient                         was deemed in satisfactory condition to undergo the                         procedure.                        After obtaining informed consent, the colonoscope was  passed under direct vision. Throughout the procedure,                         the patient's blood pressure, pulse, and oxygen                         saturations were monitored continuously. The                         Colonoscope was introduced through  the anus and                         advanced to the the cecum, identified by appendiceal                         orifice and ileocecal valve. The colonoscopy was                         performed without difficulty. The patient tolerated                         the procedure well. The quality of the bowel                         preparation was excellent. Findings:      The perianal and digital rectal examinations were normal.      Multiple small-mouthed diverticula were found in the sigmoid colon and       descending colon.      Non-bleeding internal hemorrhoids were found during retroflexion. The       hemorrhoids were Grade I (internal hemorrhoids that do not prolapse). Impression:            - Diverticulosis in the sigmoid colon and in the                         descending colon.                        - Non-bleeding internal hemorrhoids.                        - No specimens collected. Recommendation:        - Discharge patient to home.                        - Resume previous diet.                        - Continue present medications.                        - Repeat colonoscopy in 10 years for screening unless                         any change in family history or lower GI problems. Procedure Code(s):     --- Professional ---                        [phone], Colonoscopy, flexible; diagnostic, including  collection of specimen(s) by brushing or washing, when                         performed (separate procedure) Diagnosis Code(s):     --- Professional ---                        Z12.11, Encounter for screening for malignant neoplasm                         of colon CPT copyright 2019 American Medical Association. All rights reserved. The codes documented in this report are preliminary and upon coder review may  be revised to meet current compliance requirements. Midge Minium MD, MD 05/10/2019 11:02:35 AM This report has been signed electronically. Number of  Addenda: 0 Note Initiated On: 05/10/2019 10:43 AM Scope Withdrawal Time: 0 hours 6 minutes 5 seconds  Total Procedure Duration: 0 hours 9 minutes 23 seconds  Estimated Blood Loss:  Estimated blood loss: none.      [facility]

## 2019-05-10 NOTE — H&P (Signed)
Midge Minium, MD [facility] [address], [address] [place], [place] [address] Phone: [phone] Fax : [phone]  Primary Care Physician:  Ethelda Chick, MD Primary Gastroenterologist:  Dr. Servando Snare  Pre-Procedure History & Physical: HPI:  Cynthia Richards is a 59 y.o. female is here for a screening colonoscopy.   Past Medical History:  Diagnosis Date  . Anxiety   . Depression   . Genital HSV   . Hyperlipidemia   . Kidney stones   . Lichen sclerosus     Past Surgical History:  Procedure Laterality Date  . BREAST BIOPSY Right 07/1995   Core biopsy-benign fibrocystic disease  . FOOT SURGERY Bilateral    bunions  . KNEE ARTHROSCOPY Right   . LITHOTRIPSY  10/17/10  . TONSILLECTOMY      Prior to Admission medications   Medication Sig Start Date End Date Taking? Authorizing Provider  Biotin 3 MG TABS Take by mouth.   Yes [provider]  citalopram (CELEXA) 20 MG tablet Take 1 tablet (20 mg total) by mouth daily. 04/10/17  Yes Ethelda Chick, MD  Diphenhydramine-APAP, sleep, (TYLENOL PM EXTRA STRENGTH) 50-1000 MG/30ML LIQD Take by mouth.   Yes [provider]  docusate sodium (COLACE) 100 MG capsule Take by mouth.   Yes [provider]  Fish Oil-Cholecalciferol (FISH OIL + D3 PO) Take by mouth.   Yes [provider]  Multiple Vitamin tablet Take by mouth.   Yes [provider]  simvastatin (ZOCOR) 40 MG tablet Take 1 tablet (40 mg total) by mouth at bedtime. 04/10/17  Yes Ethelda Chick, MD  valACYclovir (VALTREX) 500 MG tablet Take 1 tablet (500 mg total) by mouth 2 (two) times daily. 04/10/17  Yes Ethelda Chick, MD  ciprofloxacin (CIPRO) 500 MG tablet Take 1 tablet (500 mg total) by mouth 2 (two) times daily. Patient not taking: Reported on 05/10/2019 05/08/17   Ethelda Chick, MD  clobetasol cream (TEMOVATE) 0.05 % Apply 1 application topically 2 (two) times daily. 04/14/15   Ethelda Chick, MD  ESTRACE VAGINAL 0.1 MG/GM vaginal  cream  03/09/15   [provider]  metroNIDAZOLE (FLAGYL) 500 MG tablet Take 1 tablet (500 mg total) by mouth 2 (two) times daily. Patient not taking: Reported on 05/10/2019 05/08/17   Ethelda Chick, MD  nystatin ointment (MYCOSTATIN) Apply 1 application topically 2 (two) times daily. 10/13/15   Ethelda Chick, MD  phenazopyridine (PYRIDIUM) 100 MG tablet Take 1-2 tablets (100-200 mg total) by mouth 3 (three) times daily as needed for pain. Patient not taking: Reported on 05/10/2019 05/08/17   Ethelda Chick, MD  triamcinolone cream (KENALOG) 0.1 % Apply 1 application topically 2 (two) times daily. 04/19/16   Ethelda Chick, MD    Allergies as of 04/17/2019 - Review Complete 05/08/2017  Allergen Reaction Noted  . Codeine Nausea And Vomiting 09/18/2014  . Sulfa antibiotics Rash 09/18/2014  . Sulfacetamide sodium Rash 03/20/2015    Family History  Problem Relation Age of Onset  . Arthritis Mother   . Hyperlipidemia Mother   . Fibromyalgia Mother   . Osteoporosis Mother   . Lupus Mother   . Stroke Mother   . Brain cancer Father   . Diabetes Father   . Hypertension Father   . Cancer Father 45       brain cancer  . Heart disease Maternal Grandmother   . Hypertension Maternal Grandmother     Social History   Socioeconomic  History  . Marital status: Divorced    Spouse name: Not on file  . Number of children: 2  . Years of education: Not on file  . Highest education level: Not on file  Occupational History  . Occupation: Secretary/administrator  Tobacco Use  . Smoking status: Never Smoker  . Smokeless tobacco: Never Used  Substance and Sexual Activity  . Alcohol use: No  . Drug use: No  . Sexual activity: Not Currently    Birth control/protection: Post-menopausal  Other Topics Concern  . Not on file  Social History Narrative   Marital status: divorced in 2007 after 18 years of marriage due to husband's alcoholism; dating in 2019 for two years;  Younger boyfriend age  42 yo. Eric.      Children: 2 daughters; no grandchildren      Lives: alone; daughters in [place] and [place]      Employment: [employer] since 2011 in [place].      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      Seatbelt: 100%; no texting   Social Determinants of Health   Financial Resource Strain:   . Difficulty of Paying Living Expenses:   Food Insecurity:   . Worried About Charity fundraiser in the Last Year:   . Arboriculturist in the Last Year:   Transportation Needs:   . Film/video editor (Medical):   Marland Kitchen Lack of Transportation (Non-Medical):   Physical Activity:   . Days of Exercise per Week:   . Minutes of Exercise per Session:   Stress:   . Feeling of Stress :   Social Connections:   . Frequency of Communication with Friends and Family:   . Frequency of Social Gatherings with Friends and Family:   . Attends Religious Services:   . Active Member of Clubs or Organizations:   . Attends Archivist Meetings:   Marland Kitchen Marital Status:   Intimate Partner Violence:   . Fear of Current or Ex-Partner:   . Emotionally Abused:   Marland Kitchen Physically Abused:   . Sexually Abused:     Review of Systems: See HPI, otherwise negative ROS  Physical Exam: BP 129/86   Pulse 77   Temp 98.8 F (37.1 C) (Temporal)   Resp 18   Ht 5\' 5"  (1.651 m)   Wt 64.4 kg   SpO2 96%   BMI 23.63 kg/m  General:   Alert,  pleasant and cooperative in NAD Head:  Normocephalic and atraumatic. Neck:  Supple; no masses or thyromegaly. Lungs:  Clear throughout to auscultation.    Heart:  Regular rate and rhythm. Abdomen:  Soft, nontender and nondistended. Normal bowel sounds, without guarding, and without rebound.   Neurologic:  Alert and  oriented x4;  grossly normal neurologically.  Impression/Plan: Cynthia Richards is now here to undergo a screening colonoscopy.  Risks, benefits, and alternatives regarding  colonoscopy have been reviewed with the patient.  Questions have been answered.  All parties agreeable.

## 2019-05-10 NOTE — Anesthesia Preprocedure Evaluation (Signed)
Anesthesia Evaluation  Patient identified by MRN, date of birth, ID band Patient awake    Reviewed: Allergy & Precautions, NPO status , Patient's Chart, lab work & pertinent test results  Airway Mallampati: II       Dental   Pulmonary neg pulmonary ROS,    Pulmonary exam normal        Cardiovascular negative cardio ROS Normal cardiovascular exam     Neuro/Psych PSYCHIATRIC DISORDERS Anxiety Depression negative neurological ROS     GI/Hepatic negative GI ROS, Neg liver ROS,   Endo/Other  negative endocrine ROS  Renal/GU stones  negative genitourinary   Musculoskeletal negative musculoskeletal ROS (+)   Abdominal Normal abdominal exam  (+)   Peds negative pediatric ROS (+)  Hematology negative hematology ROS (+)   Anesthesia Other Findings   Reproductive/Obstetrics                             Anesthesia Physical Anesthesia Plan  ASA: II  Anesthesia Plan: General   Post-op Pain Management:    Induction: Intravenous  PONV Risk Score and Plan:   Airway Management Planned: Nasal Cannula  Additional Equipment:   Intra-op Plan:   Post-operative Plan:   Informed Consent: I have reviewed the patients History and Physical, chart, labs and discussed the procedure including the risks, benefits and alternatives for the proposed anesthesia with the patient or authorized representative who has indicated his/her understanding and acceptance.     Dental advisory given  Plan Discussed with: CRNA and Surgeon  Anesthesia Plan Comments:         Anesthesia Quick Evaluation

## 2019-05-13 ENCOUNTER — Encounter: Payer: Self-pay | Admitting: *Deleted

## 2021-04-14 DIAGNOSIS — R300 Dysuria: Secondary | ICD-10-CM | POA: Diagnosis not present

## 2021-04-14 DIAGNOSIS — N3000 Acute cystitis without hematuria: Secondary | ICD-10-CM | POA: Diagnosis not present

## 2021-05-13 DIAGNOSIS — B9689 Other specified bacterial agents as the cause of diseases classified elsewhere: Secondary | ICD-10-CM | POA: Diagnosis not present

## 2021-05-13 DIAGNOSIS — J019 Acute sinusitis, unspecified: Secondary | ICD-10-CM | POA: Diagnosis not present

## 2021-05-13 DIAGNOSIS — J040 Acute laryngitis: Secondary | ICD-10-CM | POA: Diagnosis not present

## 2021-06-03 DIAGNOSIS — L680 Hirsutism: Secondary | ICD-10-CM | POA: Diagnosis not present

## 2021-06-03 DIAGNOSIS — D1723 Benign lipomatous neoplasm of skin and subcutaneous tissue of right leg: Secondary | ICD-10-CM | POA: Diagnosis not present

## 2021-06-03 DIAGNOSIS — L700 Acne vulgaris: Secondary | ICD-10-CM | POA: Diagnosis not present

## 2021-06-03 DIAGNOSIS — L249 Irritant contact dermatitis, unspecified cause: Secondary | ICD-10-CM | POA: Diagnosis not present

## 2021-06-03 DIAGNOSIS — Z86018 Personal history of other benign neoplasm: Secondary | ICD-10-CM | POA: Diagnosis not present

## 2021-06-03 DIAGNOSIS — L578 Other skin changes due to chronic exposure to nonionizing radiation: Secondary | ICD-10-CM | POA: Diagnosis not present

## 2021-06-03 DIAGNOSIS — Z872 Personal history of diseases of the skin and subcutaneous tissue: Secondary | ICD-10-CM | POA: Diagnosis not present

## 2021-06-09 DIAGNOSIS — N3010 Interstitial cystitis (chronic) without hematuria: Secondary | ICD-10-CM | POA: Diagnosis not present

## 2021-06-09 DIAGNOSIS — Z0000 Encounter for general adult medical examination without abnormal findings: Secondary | ICD-10-CM | POA: Diagnosis not present

## 2021-06-09 DIAGNOSIS — Z124 Encounter for screening for malignant neoplasm of cervix: Secondary | ICD-10-CM | POA: Diagnosis not present

## 2021-06-09 DIAGNOSIS — E7800 Pure hypercholesterolemia, unspecified: Secondary | ICD-10-CM | POA: Diagnosis not present

## 2021-06-09 DIAGNOSIS — R69 Illness, unspecified: Secondary | ICD-10-CM | POA: Diagnosis not present

## 2021-06-09 DIAGNOSIS — J301 Allergic rhinitis due to pollen: Secondary | ICD-10-CM | POA: Diagnosis not present

## 2021-06-09 DIAGNOSIS — N200 Calculus of kidney: Secondary | ICD-10-CM | POA: Diagnosis not present

## 2021-06-09 DIAGNOSIS — N952 Postmenopausal atrophic vaginitis: Secondary | ICD-10-CM | POA: Diagnosis not present

## 2021-06-24 DIAGNOSIS — Z20822 Contact with and (suspected) exposure to covid-19: Secondary | ICD-10-CM | POA: Diagnosis not present

## 2021-06-24 DIAGNOSIS — J0100 Acute maxillary sinusitis, unspecified: Secondary | ICD-10-CM | POA: Diagnosis not present

## 2021-06-24 DIAGNOSIS — R509 Fever, unspecified: Secondary | ICD-10-CM | POA: Diagnosis not present

## 2021-08-29 DIAGNOSIS — R509 Fever, unspecified: Secondary | ICD-10-CM | POA: Diagnosis not present

## 2021-08-29 DIAGNOSIS — R058 Other specified cough: Secondary | ICD-10-CM | POA: Diagnosis not present

## 2021-08-29 DIAGNOSIS — U071 COVID-19: Secondary | ICD-10-CM | POA: Diagnosis not present

## 2021-08-29 DIAGNOSIS — J3489 Other specified disorders of nose and nasal sinuses: Secondary | ICD-10-CM | POA: Diagnosis not present

## 2021-08-29 DIAGNOSIS — R0981 Nasal congestion: Secondary | ICD-10-CM | POA: Diagnosis not present

## 2021-12-14 DIAGNOSIS — E7800 Pure hypercholesterolemia, unspecified: Secondary | ICD-10-CM | POA: Diagnosis not present

## 2021-12-14 DIAGNOSIS — Z1331 Encounter for screening for depression: Secondary | ICD-10-CM | POA: Diagnosis not present

## 2021-12-14 DIAGNOSIS — R7303 Prediabetes: Secondary | ICD-10-CM | POA: Diagnosis not present

## 2021-12-14 DIAGNOSIS — R69 Illness, unspecified: Secondary | ICD-10-CM | POA: Diagnosis not present

## 2021-12-14 DIAGNOSIS — Z23 Encounter for immunization: Secondary | ICD-10-CM | POA: Diagnosis not present

## 2021-12-14 DIAGNOSIS — F411 Generalized anxiety disorder: Secondary | ICD-10-CM | POA: Diagnosis not present

## 2022-02-01 DIAGNOSIS — J069 Acute upper respiratory infection, unspecified: Secondary | ICD-10-CM | POA: Diagnosis not present

## 2022-03-14 DIAGNOSIS — Z1231 Encounter for screening mammogram for malignant neoplasm of breast: Secondary | ICD-10-CM | POA: Diagnosis not present

## 2022-03-18 DIAGNOSIS — R928 Other abnormal and inconclusive findings on diagnostic imaging of breast: Secondary | ICD-10-CM | POA: Diagnosis not present

## 2022-03-18 DIAGNOSIS — R922 Inconclusive mammogram: Secondary | ICD-10-CM | POA: Diagnosis not present

## 2022-06-22 DIAGNOSIS — R300 Dysuria: Secondary | ICD-10-CM | POA: Diagnosis not present

## 2022-06-22 DIAGNOSIS — Z6823 Body mass index (BMI) 23.0-23.9, adult: Secondary | ICD-10-CM | POA: Diagnosis not present

## 2022-06-22 DIAGNOSIS — R350 Frequency of micturition: Secondary | ICD-10-CM | POA: Diagnosis not present

## 2022-07-05 DIAGNOSIS — F320 Major depressive disorder, single episode, mild: Secondary | ICD-10-CM | POA: Diagnosis not present

## 2022-07-05 DIAGNOSIS — Z0000 Encounter for general adult medical examination without abnormal findings: Secondary | ICD-10-CM | POA: Diagnosis not present

## 2022-07-05 DIAGNOSIS — R7303 Prediabetes: Secondary | ICD-10-CM | POA: Diagnosis not present

## 2022-07-05 DIAGNOSIS — N3010 Interstitial cystitis (chronic) without hematuria: Secondary | ICD-10-CM | POA: Diagnosis not present

## 2022-07-05 DIAGNOSIS — L237 Allergic contact dermatitis due to plants, except food: Secondary | ICD-10-CM | POA: Diagnosis not present

## 2022-07-05 DIAGNOSIS — F411 Generalized anxiety disorder: Secondary | ICD-10-CM | POA: Diagnosis not present

## 2022-07-05 DIAGNOSIS — N952 Postmenopausal atrophic vaginitis: Secondary | ICD-10-CM | POA: Diagnosis not present

## 2022-07-05 DIAGNOSIS — A6004 Herpesviral vulvovaginitis: Secondary | ICD-10-CM | POA: Diagnosis not present

## 2022-07-05 DIAGNOSIS — E7800 Pure hypercholesterolemia, unspecified: Secondary | ICD-10-CM | POA: Diagnosis not present

## 2022-07-15 DIAGNOSIS — D2261 Melanocytic nevi of right upper limb, including shoulder: Secondary | ICD-10-CM | POA: Diagnosis not present

## 2022-07-15 DIAGNOSIS — L821 Other seborrheic keratosis: Secondary | ICD-10-CM | POA: Diagnosis not present

## 2022-07-15 DIAGNOSIS — D2272 Melanocytic nevi of left lower limb, including hip: Secondary | ICD-10-CM | POA: Diagnosis not present

## 2022-07-15 DIAGNOSIS — L720 Epidermal cyst: Secondary | ICD-10-CM | POA: Diagnosis not present

## 2022-07-15 DIAGNOSIS — D225 Melanocytic nevi of trunk: Secondary | ICD-10-CM | POA: Diagnosis not present

## 2022-07-15 DIAGNOSIS — D2271 Melanocytic nevi of right lower limb, including hip: Secondary | ICD-10-CM | POA: Diagnosis not present

## 2022-07-15 DIAGNOSIS — L814 Other melanin hyperpigmentation: Secondary | ICD-10-CM | POA: Diagnosis not present

## 2022-07-15 DIAGNOSIS — D2262 Melanocytic nevi of left upper limb, including shoulder: Secondary | ICD-10-CM | POA: Diagnosis not present

## 2022-09-14 DIAGNOSIS — R140 Abdominal distension (gaseous): Secondary | ICD-10-CM | POA: Diagnosis not present

## 2022-09-14 DIAGNOSIS — R300 Dysuria: Secondary | ICD-10-CM | POA: Diagnosis not present

## 2022-09-14 DIAGNOSIS — N898 Other specified noninflammatory disorders of vagina: Secondary | ICD-10-CM | POA: Diagnosis not present

## 2023-01-17 ENCOUNTER — Ambulatory Visit
Admission: EM | Admit: 2023-01-17 | Discharge: 2023-01-17 | Disposition: A | Discharge status: Home / Self Care | Payer: 59 | Attending: Family | Admitting: Family

## 2023-01-17 ENCOUNTER — Ambulatory Visit: Payer: 59

## 2023-01-17 ENCOUNTER — Telehealth: Payer: Self-pay | Admitting: Family

## 2023-01-17 DIAGNOSIS — R059 Cough, unspecified: Secondary | ICD-10-CM | POA: Diagnosis not present

## 2023-01-17 DIAGNOSIS — R071 Chest pain on breathing: Secondary | ICD-10-CM

## 2023-01-17 DIAGNOSIS — H6692 Otitis media, unspecified, left ear: Secondary | ICD-10-CM

## 2023-01-17 DIAGNOSIS — R051 Acute cough: Secondary | ICD-10-CM

## 2023-01-17 DIAGNOSIS — R062 Wheezing: Secondary | ICD-10-CM | POA: Diagnosis not present

## 2023-01-17 DIAGNOSIS — R918 Other nonspecific abnormal finding of lung field: Secondary | ICD-10-CM | POA: Diagnosis not present

## 2023-01-17 MED ORDER — AMOXICILLIN-POT CLAVULANATE 875-125 MG PO TABS: 1.0000 | ORAL_TABLET | Freq: Two times a day (BID) | ORAL | 0 refills | Status: AC

## 2023-01-17 MED ORDER — PROMETHAZINE-DM 6.25-15 MG/5ML PO SYRP: 5.0000 mL | ORAL_SOLUTION | Freq: Every evening | ORAL | 0 refills | Status: AC | PRN

## 2023-01-17 MED ORDER — BENZONATATE 100 MG PO CAPS: 100.0000 mg | ORAL_CAPSULE | Freq: Three times a day (TID) | ORAL | 0 refills | Status: AC | PRN

## 2023-01-17 NOTE — Discharge Instructions (Signed)
Recommend start Augmentin 875mg  twice a day for 7 days- take with food. Take Tessalon cough pills 1 every 8 hours as needed. May use Promethazine DM cough syrup 10ml at night to help with cough. May continue Albuterol inhaler 2 puffs every 6 hours as needed for cough. We will notify you regarding your chest x-ray results tonight. Follow-up pending results.

## 2023-01-17 NOTE — ED Provider Notes (Signed)
Renaldo Fiddler    CSN: [id number] Arrival date & time: 01/17/23  1349      History   Chief Complaint Chief Complaint  Patient presents with   Cough   Nasal Congestion    HPI Cynthia Richards is a 62 y.o. female.   62 year old female presents with cough and chest congestion for the past 3 weeks. Has gotten worse in the past week with more pain with deep breathing, especially on left side radiating to her back. Started with some nasal congestion and cough. Then over the past week she has had more chest congestion and difficulty breathing. Has been unable to sleep due to cough and now left ear hurts. She has noticed a "gurgling" sound when breathing. She has tried Motrin, Tylenol, Mucinex and Albuterol inhaler with minimal relief. She also took 4 days of 20mg  of Prednisone with no help (finished 2 days ago). Concern over pneumonia. Having multiple people over for Thanksgiving in 2 days and concern about illness. Low grade fever. No nausea or vomiting. Other chronic health issues include hyperlipidemia which is controlled with Zocor and anxiety/depression in which she takes Celexa. No history of tobacco use. No history of asthma.   The history is provided by the patient.    Past Medical History:  Diagnosis Date   Anxiety    Depression    Genital HSV    Hyperlipidemia    Kidney stones    Lichen sclerosus     Patient Active Problem List   Diagnosis Date Noted   Encounter for screening colonoscopy    Anxiety and depression 10/18/2016   Nephrolithiasis 10/18/2016   Glucose intolerance (impaired glucose tolerance) 10/13/2015   H/O renal calculi 09/18/2014   Hypercholesteremia 09/18/2014   Chronic interstitial cystitis 09/18/2014   Lichen sclerosus 09/18/2014   Female climacteric state 09/18/2014   Bladder infection, chronic 01/23/2012   Mixed urge and stress incontinence 01/23/2012    Past Surgical History:  Procedure Laterality Date   BREAST BIOPSY Right 07/1995    Core biopsy-benign fibrocystic disease   COLONOSCOPY WITH PROPOFOL N/A 05/10/2019   Procedure: COLONOSCOPY WITH PROPOFOL;  Surgeon: Midge Minium, MD;  Location: [facility];  Service: Endoscopy;  Laterality: N/A;   FOOT SURGERY Bilateral    bunions   KNEE ARTHROSCOPY Right    LITHOTRIPSY  10/17/10   TONSILLECTOMY      OB History   No obstetric history on file.      Home Medications    Prior to Admission medications   Medication Sig Start Date End Date Taking? Authorizing Provider  amoxicillin-clavulanate (AUGMENTIN) 875-125 MG tablet Take 1 tablet by mouth every 12 (twelve) hours for 7 days. 01/17/23 01/24/23 Yes Aniah Pauli, Ali Lowe, NP  benzonatate (TESSALON) 100 MG capsule Take 1 capsule (100 mg total) by mouth every 8 (eight) hours as needed for cough. 01/17/23  Yes Jasmynn Pfalzgraf, Ali Lowe, NP  promethazine-dextromethorphan (PROMETHAZINE-DM) 6.25-15 MG/5ML syrup Take 5 mLs by mouth at bedtime as needed for cough. 01/17/23  Yes Lakeishia Truluck, Ali Lowe, NP  Biotin 3 MG TABS Take by mouth.    [provider]  citalopram (CELEXA) 20 MG tablet Take 1 tablet (20 mg total) by mouth daily. 04/10/17   Ethelda Chick, MD  clobetasol cream (TEMOVATE) 0.05 % Apply 1 application topically 2 (two) times daily. 04/14/15   Ethelda Chick, MD  Diphenhydramine-APAP, sleep, (TYLENOL PM EXTRA STRENGTH) 50-1000 MG/30ML LIQD Take by mouth.    [provider]  docusate  sodium (COLACE) 100 MG capsule Take by mouth.    [provider]  ESTRACE VAGINAL 0.1 MG/GM vaginal cream  03/09/15   [provider]  Fish Oil-Cholecalciferol (FISH OIL + D3 PO) Take by mouth.    [provider]  Multiple Vitamin tablet Take by mouth.    [provider]  simvastatin (ZOCOR) 40 MG tablet Take 1 tablet (40 mg total) by mouth at bedtime. 04/10/17   Ethelda Chick, MD  triamcinolone cream (KENALOG) 0.1 % Apply 1 application topically 2 (two) times daily. 04/19/16   Ethelda Chick, MD   valACYclovir (VALTREX) 500 MG tablet Take 1 tablet (500 mg total) by mouth 2 (two) times daily. 04/10/17   Ethelda Chick, MD    Family History Family History  Problem Relation Age of Onset   Arthritis Mother    Hyperlipidemia Mother    Fibromyalgia Mother    Osteoporosis Mother    Lupus Mother    Stroke Mother    Brain cancer Father    Diabetes Father    Hypertension Father    Cancer Father 72       brain cancer   Heart disease Maternal Grandmother    Hypertension Maternal Grandmother     Social History Social History   Tobacco Use   Smoking status: Never   Smokeless tobacco: Never  Vaping Use   Vaping status: Never Used  Substance Use Topics   Alcohol use: No   Drug use: No     Allergies   Codeine, Sulfa antibiotics, and Sulfacetamide sodium   Review of Systems Review of Systems  Constitutional:  Positive for fatigue and fever (low grade). Negative for appetite change, chills and diaphoresis.  HENT:  Positive for congestion, ear pain (left more than right) and sinus pressure. Negative for ear discharge, facial swelling, nosebleeds, rhinorrhea, sore throat and trouble swallowing.   Eyes:  Negative for discharge, redness and itching.  Respiratory:  Positive for cough, chest tightness, shortness of breath and wheezing.   Cardiovascular:  Positive for chest pain (left lower radiating to back). Negative for palpitations.  Gastrointestinal:  Negative for nausea and vomiting.  Musculoskeletal:  Negative for arthralgias, myalgias, neck pain and neck stiffness.  Skin:  Negative for color change and rash.  Allergic/Immunologic: Negative for environmental allergies, food allergies and immunocompromised state.  Neurological:  Negative for dizziness, tremors, seizures, syncope, speech difficulty, light-headedness and numbness.  Hematological:  Negative for adenopathy. Does not bruise/bleed easily.  Psychiatric/Behavioral:  Positive for sleep disturbance.      Physical  Exam Triage Vital Signs ED Triage Vitals  Encounter Vitals Group     BP 01/17/23 1422 (!) 152/84     Systolic BP Percentile --      Diastolic BP Percentile --      Pulse Rate 01/17/23 1422 81     Resp 01/17/23 1422 18     Temp 01/17/23 1422 99 F (37.2 C)     Temp src --      SpO2 01/17/23 1422 94 %     Weight --      Height --      Head Circumference --      Peak Flow --      Pain Score 01/17/23 1423 3     Pain Loc --      Pain Education --      Exclude from Growth Chart --    No data found.  Updated Vital Signs BP (!) 152/84  Pulse 81   Temp 99 F (37.2 C)   Resp 18   SpO2 94%   Visual Acuity Right Eye Distance:   Left Eye Distance:   Bilateral Distance:    Right Eye Near:   Left Eye Near:    Bilateral Near:     Physical Exam Vitals and nursing note reviewed.  Constitutional:      General: She is awake. She is not in acute distress.    Appearance: She is well-developed and well-groomed.     Comments: She is sitting in the exam chair in no acute distress but appears tired.   HENT:     Head: Normocephalic and atraumatic.     Right Ear: Hearing, ear canal and external ear normal. No drainage. No middle ear effusion. Tympanic membrane is bulging. Tympanic membrane is not injected, perforated or erythematous.     Left Ear: Hearing, ear canal and external ear normal. No drainage. A middle ear effusion is present. Tympanic membrane is injected, erythematous and bulging. Tympanic membrane is not perforated.     Nose: Congestion present.     Right Sinus: No maxillary sinus tenderness or frontal sinus tenderness.     Left Sinus: No maxillary sinus tenderness or frontal sinus tenderness.     Mouth/Throat:     Lips: Pink.     Mouth: Mucous membranes are moist.     Pharynx: Uvula midline. Posterior oropharyngeal erythema and postnasal drip present. No pharyngeal swelling, oropharyngeal exudate or uvula swelling.  Eyes:     Extraocular Movements: Extraocular movements  intact.     Conjunctiva/sclera: Conjunctivae normal.  Cardiovascular:     Rate and Rhythm: Normal rate and regular rhythm.     Heart sounds: Normal heart sounds. No murmur heard. Pulmonary:     Effort: Pulmonary effort is normal. No tachypnea, accessory muscle usage, respiratory distress or retractions.     Breath sounds: Normal air entry. No decreased air movement. Examination of the right-upper field reveals rhonchi. Examination of the left-upper field reveals rhonchi. Examination of the right-middle field reveals rhonchi. Examination of the right-lower field reveals decreased breath sounds, wheezing and rhonchi. Examination of the left-lower field reveals decreased breath sounds, wheezing and rhonchi. Decreased breath sounds, wheezing and rhonchi present. No rales.     Comments: Wheezes and slightly decreased breath sounds in the lower lobes, mainly with exhaling or coughing. Coarse breath sounds heard in all lung fields, mainly with coughing.  Musculoskeletal:     Cervical back: Normal range of motion and neck supple.  Lymphadenopathy:     Cervical: No cervical adenopathy.  Skin:    General: Skin is warm and dry.     Capillary Refill: Capillary refill takes less than 2 seconds.     Findings: No rash.  Neurological:     General: No focal deficit present.     Mental Status: She is alert and oriented to person, place, and time.  Psychiatric:        Mood and Affect: Mood normal.        Behavior: Behavior normal. Behavior is cooperative.        Thought Content: Thought content normal.        Judgment: Judgment normal.      UC Treatments / Results  Labs (all labs ordered are listed, but only abnormal results are displayed) Labs Reviewed - No data to display  EKG   Radiology DG Chest 2 View  Result Date: 01/17/2023 CLINICAL DATA:  Cough with wheezing and  pain with breathing. EXAM: CHEST - 2 VIEW COMPARISON:  None Available. FINDINGS: Image quality degraded by artifact. The heart  size and mediastinal contours are normal. Mild aortic atherosclerosis. There is central airway thickening with linear opacity in the lingula, likely atelectasis or scarring. No confluent airspace disease, pleural effusion or pneumothorax. Mild thoracic spine degenerative changes without acute osseous findings. IMPRESSION: Central airway thickening suggesting bronchitis or reactive airways disease. No evidence of pneumonia. Electronically Signed   By: Carey Bullocks M.D.   On: 01/17/2023 17:00    Procedures Procedures (including critical care time)  Medications Ordered in UC Medications - No data to display  Initial Impression / Assessment and Plan / UC Course  I have reviewed the triage vital signs and the nursing notes.  Pertinent labs & imaging results that were available during my care of the patient were reviewed by me and considered in my medical decision making (see chart for details).     Obtained chest X-ray- results not available before patient had to leave to pick up her grandson. Will call patient with x-ray results.  Discussed that she does have a left inner ear infection. Will start Augmentin 875mg  twice a day for 7 days- take with food. May take Tessalon cough pills 100mg  every 8 hours as needed. May also use Promethazine DM cough syrup 10ml at bedtime to help with cough. Continue Albuterol inhaler 2 puffs every 6 hours as needed for cough and wheezing. No repeat Prednisone/steroids indicated at this time. Continue to monitor symptoms. Will notify her of chest x-ray results this evening. Follow-up pending results.   Final Clinical Impressions(s) / UC Diagnoses   Final diagnoses:  Acute cough  Wheezing  Chest pain on breathing  Acute left otitis media     Discharge Instructions      Recommend start Augmentin 875mg  twice a day for 7 days- take with food. Take Tessalon cough pills 1 every 8 hours as needed. May use Promethazine DM cough syrup 10ml at night to help with  cough. May continue Albuterol inhaler 2 puffs every 6 hours as needed for cough. We will notify you regarding your chest x-ray results tonight. Follow-up pending results.      ED Prescriptions     Medication Sig Dispense Auth. Provider   amoxicillin-clavulanate (AUGMENTIN) 875-125 MG tablet Take 1 tablet by mouth every 12 (twelve) hours for 7 days. 14 tablet Quindarrius Joplin, Ali Lowe, NP   benzonatate (TESSALON) 100 MG capsule Take 1 capsule (100 mg total) by mouth every 8 (eight) hours as needed for cough. 21 capsule Sudie Grumbling, NP   promethazine-dextromethorphan (PROMETHAZINE-DM) 6.25-15 MG/5ML syrup Take 5 mLs by mouth at bedtime as needed for cough. 118 mL Sudie Grumbling, NP      PDMP not reviewed this encounter.   Sudie Grumbling, NP 01/17/23 2007

## 2023-01-17 NOTE — ED Triage Notes (Signed)
Patient to Urgent Care with complaints of cough and nasal/ chest congestion. Now having some pain in her left ribs/ back. Soreness with breathing. Left sided ear pain. Poor sleep.   Reports symptoms started three weeks ago.  Has been taking Mucinex/ Mucinex sinus spray/ tylenol/ albuterol inhaler/ 20mg  prednisone/ honey.

## 2023-01-17 NOTE — Telephone Encounter (Signed)
Reviewed chest x-ray results with patient. No pneumonia or fluid in lungs. Does appear to have bronchitis. Recommend start Augmentin as prescribed. Continue cough pills and syrup as recommended. Continue Albuterol inhaler as directed. Follow-up in 4 to 5 days with your PCP if not improving.

## 2023-01-24 DIAGNOSIS — I700 Atherosclerosis of aorta: Secondary | ICD-10-CM | POA: Diagnosis not present

## 2023-01-24 DIAGNOSIS — R7303 Prediabetes: Secondary | ICD-10-CM | POA: Diagnosis not present

## 2023-01-24 DIAGNOSIS — D7589 Other specified diseases of blood and blood-forming organs: Secondary | ICD-10-CM | POA: Diagnosis not present

## 2023-01-24 DIAGNOSIS — Z1331 Encounter for screening for depression: Secondary | ICD-10-CM | POA: Diagnosis not present

## 2023-01-24 DIAGNOSIS — E7800 Pure hypercholesterolemia, unspecified: Secondary | ICD-10-CM | POA: Diagnosis not present

## 2023-01-24 DIAGNOSIS — F411 Generalized anxiety disorder: Secondary | ICD-10-CM | POA: Diagnosis not present

## 2023-01-24 DIAGNOSIS — Z23 Encounter for immunization: Secondary | ICD-10-CM | POA: Diagnosis not present

## 2023-01-24 DIAGNOSIS — Z1330 Encounter for screening examination for mental health and behavioral disorders, unspecified: Secondary | ICD-10-CM | POA: Diagnosis not present

## 2023-08-15 ENCOUNTER — Ambulatory Visit: Admitting: Physician Assistant

## 2023-10-27 DIAGNOSIS — Z0000 Encounter for general adult medical examination without abnormal findings: Secondary | ICD-10-CM | POA: Diagnosis not present

## 2023-10-27 DIAGNOSIS — Z1331 Encounter for screening for depression: Secondary | ICD-10-CM | POA: Diagnosis not present

## 2023-10-27 DIAGNOSIS — F411 Generalized anxiety disorder: Secondary | ICD-10-CM | POA: Diagnosis not present

## 2023-10-27 DIAGNOSIS — N3010 Interstitial cystitis (chronic) without hematuria: Secondary | ICD-10-CM | POA: Diagnosis not present

## 2023-10-27 DIAGNOSIS — Z87442 Personal history of urinary calculi: Secondary | ICD-10-CM | POA: Diagnosis not present

## 2023-10-27 DIAGNOSIS — R7303 Prediabetes: Secondary | ICD-10-CM | POA: Diagnosis not present

## 2023-10-27 DIAGNOSIS — Z780 Asymptomatic menopausal state: Secondary | ICD-10-CM | POA: Diagnosis not present

## 2023-10-27 DIAGNOSIS — E7800 Pure hypercholesterolemia, unspecified: Secondary | ICD-10-CM | POA: Diagnosis not present

## 2023-10-27 DIAGNOSIS — N200 Calculus of kidney: Secondary | ICD-10-CM | POA: Diagnosis not present

## 2023-10-27 DIAGNOSIS — F419 Anxiety disorder, unspecified: Secondary | ICD-10-CM | POA: Diagnosis not present

## 2023-10-27 DIAGNOSIS — Z1330 Encounter for screening examination for mental health and behavioral disorders, unspecified: Secondary | ICD-10-CM | POA: Diagnosis not present

## 2023-10-27 DIAGNOSIS — Z131 Encounter for screening for diabetes mellitus: Secondary | ICD-10-CM | POA: Diagnosis not present

## 2023-10-27 DIAGNOSIS — Z13220 Encounter for screening for lipoid disorders: Secondary | ICD-10-CM | POA: Diagnosis not present

## 2023-10-31 DIAGNOSIS — K080 Exfoliation of teeth due to systemic causes: Secondary | ICD-10-CM | POA: Diagnosis not present

## 2023-11-02 DIAGNOSIS — M9903 Segmental and somatic dysfunction of lumbar region: Secondary | ICD-10-CM | POA: Diagnosis not present

## 2023-11-02 DIAGNOSIS — M9906 Segmental and somatic dysfunction of lower extremity: Secondary | ICD-10-CM | POA: Diagnosis not present

## 2023-11-02 DIAGNOSIS — M9901 Segmental and somatic dysfunction of cervical region: Secondary | ICD-10-CM | POA: Diagnosis not present

## 2023-11-02 DIAGNOSIS — M9902 Segmental and somatic dysfunction of thoracic region: Secondary | ICD-10-CM | POA: Diagnosis not present

## 2023-11-10 DIAGNOSIS — M9901 Segmental and somatic dysfunction of cervical region: Secondary | ICD-10-CM | POA: Diagnosis not present

## 2023-11-10 DIAGNOSIS — M9902 Segmental and somatic dysfunction of thoracic region: Secondary | ICD-10-CM | POA: Diagnosis not present

## 2023-11-10 DIAGNOSIS — M9906 Segmental and somatic dysfunction of lower extremity: Secondary | ICD-10-CM | POA: Diagnosis not present

## 2023-11-10 DIAGNOSIS — M9903 Segmental and somatic dysfunction of lumbar region: Secondary | ICD-10-CM | POA: Diagnosis not present

## 2023-11-13 DIAGNOSIS — M8588 Other specified disorders of bone density and structure, other site: Secondary | ICD-10-CM | POA: Diagnosis not present

## 2023-11-14 DIAGNOSIS — I517 Cardiomegaly: Secondary | ICD-10-CM | POA: Diagnosis not present

## 2023-11-15 ENCOUNTER — Encounter: Payer: Self-pay | Admitting: Physician Assistant

## 2023-11-15 ENCOUNTER — Ambulatory Visit: Admitting: Physician Assistant

## 2023-11-15 VITALS — BP 124/84 | HR 72

## 2023-11-15 DIAGNOSIS — Z1283 Encounter for screening for malignant neoplasm of skin: Secondary | ICD-10-CM | POA: Diagnosis not present

## 2023-11-15 DIAGNOSIS — B079 Viral wart, unspecified: Secondary | ICD-10-CM

## 2023-11-15 DIAGNOSIS — L710 Perioral dermatitis: Secondary | ICD-10-CM

## 2023-11-15 DIAGNOSIS — L988 Other specified disorders of the skin and subcutaneous tissue: Secondary | ICD-10-CM

## 2023-11-15 DIAGNOSIS — L814 Other melanin hyperpigmentation: Secondary | ICD-10-CM

## 2023-11-15 DIAGNOSIS — W908XXA Exposure to other nonionizing radiation, initial encounter: Secondary | ICD-10-CM

## 2023-11-15 DIAGNOSIS — D1801 Hemangioma of skin and subcutaneous tissue: Secondary | ICD-10-CM

## 2023-11-15 DIAGNOSIS — L821 Other seborrheic keratosis: Secondary | ICD-10-CM | POA: Diagnosis not present

## 2023-11-15 DIAGNOSIS — L578 Other skin changes due to chronic exposure to nonionizing radiation: Secondary | ICD-10-CM

## 2023-11-15 MED ORDER — TRETINOIN 0.1 % EX CREA: TOPICAL_CREAM | Freq: Every day | CUTANEOUS | 2 refills | Status: AC

## 2023-11-15 MED ORDER — DOXYCYCLINE HYCLATE 100 MG PO CAPS: 100.0000 mg | ORAL_CAPSULE | Freq: Every day | ORAL | 0 refills | Status: DC

## 2023-11-15 MED ORDER — TRETINOIN 0.1 % EX CREA: TOPICAL_CREAM | Freq: Every day | CUTANEOUS | 0 refills | Status: DC

## 2023-11-15 MED ORDER — DOXYCYCLINE HYCLATE 100 MG PO CAPS: 100.0000 mg | ORAL_CAPSULE | Freq: Every day | ORAL | 0 refills | Status: AC

## 2023-11-15 NOTE — Progress Notes (Signed)
 New Patient Visit   Subjective  Cynthia Richards is a 63 y.o. female NEW PATIENT who presents for the following:  Total Body Skin Exam (TBSE)  Patient present today for new patient visit for TBSE.The patient reports she has spots, moles and lesions to be evaluated, some may be new or changing and the patient may have concern these could be cancer on her left leg and in the middle of her neck. Patient has previously been treated by a dermatologist.Patient reports she has hx of bx. Patient denies family history of skin cancers. Patient reports throughout her lifetime has had moderate sun exposure. Currently, patient reports if she has excessive sun exposure, she does apply sunscreen and/or wears protective coverings.  Prior patient at [facility]. No history of skin cancer.   Other concern(s);  - rash on face around mouth  - scaly area on left lower leg   The following portions of the chart were reviewed this encounter and updated as appropriate: medications, allergies, medical history  Review of Systems:  No other skin or systemic complaints except as noted in HPI or Assessment and Plan.  Objective  Well appearing patient in no apparent distress; mood and affect are within normal limits.  A full examination was performed including scalp, head, eyes, ears, nose, lips, neck, chest, axillae, abdomen, back, buttocks, bilateral upper extremities, bilateral lower extremities, hands, feet, fingers, toes, fingernails, and toenails. All findings within normal limits unless otherwise noted below.     Relevant exam findings are noted in the Assessment and Plan.    Assessment & Plan   LENTIGINES, SEBORRHEIC KERATOSES, HEMANGIOMAS - Benign normal skin lesions - Benign-appearing - Call for any changes  MELANOCYTIC NEVI - Tan-brown and/or pink-flesh-colored symmetric macules and papules - Benign appearing on exam today - Observation - Call clinic for new or changing moles -  Recommend daily use of broad spectrum spf 30+ sunscreen to sun-exposed areas.   ACTINIC DAMAGE - Chronic condition, secondary to cumulative UV/sun exposure - diffuse scaly erythematous macules with underlying dyspigmentation - Recommend daily broad spectrum sunscreen SPF 30+ to sun-exposed areas, reapply every 2 hours as needed.  - Staying in the shade or wearing long sleeves, sun glasses (UVA+UVB protection) and wide brim hats (4-inch brim around the entire circumference of the hat) are also recommended for sun protection.  - Call for new or changing lesions.  SKIN CANCER SCREENING PERFORMED TODAY  PERIORIFICIAL DERMITIS  - start doxycycline  as directed     PERIORIFICIAL DERMATITIS   Related Medications doxycycline  (VIBRAMYCIN ) 100 MG capsule Take 1 capsule (100 mg total) by mouth daily. ONE TABLET DAILY WITH A HEAVY MEAL VIRAL WARTS, UNSPECIFIED TYPE Left Knee - Anterior Destruction of lesion - Left Knee - Anterior Complexity: simple   Destruction method: cryotherapy   Informed consent: discussed and consent obtained   Timeout:  patient name, date of birth, surgical site, and procedure verified Lesion destroyed using liquid nitrogen: Yes   Region frozen until ice ball extended beyond lesion: Yes   Outcome: patient tolerated procedure well with no complications   Post-procedure details: wound care instructions given    RHYTIDES   Related Medications tretinoin  (RETIN-A ) 0.1 % cream Apply topically at bedtime. LENTIGINES   SEBORRHEIC KERATOSIS   CHERRY ANGIOMA   ACTINIC SKIN DAMAGE   SCREENING EXAM FOR SKIN CANCER    Return in about 1 year (around 11/14/2024) for TBSE FOLLOW UP.  I, Doyce Pan, CMA, am acting as scribe for Mistina Coatney K,  PA-C.   Documentation: I have reviewed the above documentation for accuracy and completeness, and I agree with the above.  Lyndel Sarate K, PA-C

## 2023-11-15 NOTE — Patient Instructions (Signed)

## 2023-11-16 DIAGNOSIS — M9901 Segmental and somatic dysfunction of cervical region: Secondary | ICD-10-CM | POA: Diagnosis not present

## 2023-11-16 DIAGNOSIS — M9906 Segmental and somatic dysfunction of lower extremity: Secondary | ICD-10-CM | POA: Diagnosis not present

## 2023-11-16 DIAGNOSIS — M9903 Segmental and somatic dysfunction of lumbar region: Secondary | ICD-10-CM | POA: Diagnosis not present

## 2023-11-16 DIAGNOSIS — M9902 Segmental and somatic dysfunction of thoracic region: Secondary | ICD-10-CM | POA: Diagnosis not present

## 2023-11-21 DIAGNOSIS — K080 Exfoliation of teeth due to systemic causes: Secondary | ICD-10-CM | POA: Diagnosis not present

## 2023-11-30 DIAGNOSIS — M9906 Segmental and somatic dysfunction of lower extremity: Secondary | ICD-10-CM | POA: Diagnosis not present

## 2023-11-30 DIAGNOSIS — M9902 Segmental and somatic dysfunction of thoracic region: Secondary | ICD-10-CM | POA: Diagnosis not present

## 2023-11-30 DIAGNOSIS — M9901 Segmental and somatic dysfunction of cervical region: Secondary | ICD-10-CM | POA: Diagnosis not present

## 2023-11-30 DIAGNOSIS — M9903 Segmental and somatic dysfunction of lumbar region: Secondary | ICD-10-CM | POA: Diagnosis not present

## 2023-12-07 DIAGNOSIS — M9906 Segmental and somatic dysfunction of lower extremity: Secondary | ICD-10-CM | POA: Diagnosis not present

## 2023-12-07 DIAGNOSIS — M9903 Segmental and somatic dysfunction of lumbar region: Secondary | ICD-10-CM | POA: Diagnosis not present

## 2023-12-07 DIAGNOSIS — M9901 Segmental and somatic dysfunction of cervical region: Secondary | ICD-10-CM | POA: Diagnosis not present

## 2023-12-07 DIAGNOSIS — M9902 Segmental and somatic dysfunction of thoracic region: Secondary | ICD-10-CM | POA: Diagnosis not present

## 2024-11-18 ENCOUNTER — Ambulatory Visit: Admitting: Physician Assistant
